# Patient Record
Sex: Female | Born: 1955
Health system: Southern US, Community
[De-identification: ages and names within clinical notes are randomized; demographics above are authoritative.]

## PROBLEM LIST (undated history)

## (undated) DIAGNOSIS — R42 Dizziness and giddiness: Secondary | ICD-10-CM

## (undated) DIAGNOSIS — I471 Supraventricular tachycardia, unspecified: Secondary | ICD-10-CM

## (undated) DIAGNOSIS — E785 Hyperlipidemia, unspecified: Secondary | ICD-10-CM

## (undated) DIAGNOSIS — D869 Sarcoidosis, unspecified: Secondary | ICD-10-CM

## (undated) DIAGNOSIS — G43909 Migraine, unspecified, not intractable, without status migrainosus: Secondary | ICD-10-CM

## (undated) DIAGNOSIS — I639 Cerebral infarction, unspecified: Secondary | ICD-10-CM

## (undated) DIAGNOSIS — M1990 Unspecified osteoarthritis, unspecified site: Secondary | ICD-10-CM

## (undated) DIAGNOSIS — N393 Stress incontinence (female) (male): Secondary | ICD-10-CM

## (undated) HISTORY — DX: Migraine, unspecified, not intractable, without status migrainosus: G43.909

## (undated) HISTORY — DX: Hyperlipidemia, unspecified: E78.5

## (undated) HISTORY — DX: Dizziness and giddiness: R42

## (undated) HISTORY — PX: NO PAST SURGERIES: SHX2092

---

## 1997-11-15 ENCOUNTER — Other Ambulatory Visit: Admission: RE | Admit: 1997-11-15 | Discharge: 1997-11-15 | Payer: Self-pay | Admitting: Obstetrics and Gynecology

## 1998-01-03 ENCOUNTER — Other Ambulatory Visit: Admission: RE | Admit: 1998-01-03 | Discharge: 1998-01-03 | Payer: Self-pay | Admitting: Obstetrics and Gynecology

## 1998-12-03 ENCOUNTER — Ambulatory Visit (HOSPITAL_COMMUNITY): Admission: RE | Admit: 1998-12-03 | Discharge: 1998-12-03 | Payer: Self-pay | Admitting: Gastroenterology

## 1999-01-14 ENCOUNTER — Ambulatory Visit (HOSPITAL_COMMUNITY): Admission: RE | Admit: 1999-01-14 | Discharge: 1999-01-14 | Payer: Self-pay | Admitting: Gastroenterology

## 1999-01-14 ENCOUNTER — Encounter: Payer: Self-pay | Admitting: Gastroenterology

## 1999-04-28 ENCOUNTER — Other Ambulatory Visit: Admission: RE | Admit: 1999-04-28 | Discharge: 1999-04-28 | Payer: Self-pay | Admitting: Obstetrics and Gynecology

## 2000-11-30 ENCOUNTER — Other Ambulatory Visit: Admission: RE | Admit: 2000-11-30 | Discharge: 2000-11-30 | Payer: Self-pay | Admitting: Obstetrics and Gynecology

## 2002-02-21 ENCOUNTER — Encounter: Admission: RE | Admit: 2002-02-21 | Discharge: 2002-02-21 | Payer: Self-pay | Admitting: Cardiology

## 2002-02-21 ENCOUNTER — Encounter: Payer: Self-pay | Admitting: Cardiology

## 2004-07-10 ENCOUNTER — Other Ambulatory Visit: Admission: RE | Admit: 2004-07-10 | Discharge: 2004-07-10 | Payer: Self-pay | Admitting: Obstetrics and Gynecology

## 2005-04-09 ENCOUNTER — Other Ambulatory Visit: Admission: RE | Admit: 2005-04-09 | Discharge: 2005-04-09 | Payer: Self-pay | Admitting: Obstetrics and Gynecology

## 2005-07-14 ENCOUNTER — Other Ambulatory Visit: Admission: RE | Admit: 2005-07-14 | Discharge: 2005-07-14 | Payer: Self-pay | Admitting: Obstetrics and Gynecology

## 2006-03-02 ENCOUNTER — Encounter: Admission: RE | Admit: 2006-03-02 | Discharge: 2006-03-02 | Payer: Self-pay | Admitting: Internal Medicine

## 2006-03-17 ENCOUNTER — Encounter: Admission: RE | Admit: 2006-03-17 | Discharge: 2006-03-17 | Payer: Self-pay | Admitting: Cardiology

## 2006-03-25 ENCOUNTER — Ambulatory Visit: Payer: Self-pay | Admitting: Emergency Medicine

## 2006-03-31 ENCOUNTER — Encounter (INDEPENDENT_AMBULATORY_CARE_PROVIDER_SITE_OTHER): Payer: Self-pay | Admitting: Specialist

## 2006-03-31 ENCOUNTER — Ambulatory Visit: Payer: Self-pay | Admitting: Emergency Medicine

## 2006-03-31 ENCOUNTER — Ambulatory Visit (HOSPITAL_COMMUNITY): Admission: RE | Admit: 2006-03-31 | Discharge: 2006-03-31 | Payer: Self-pay | Admitting: Emergency Medicine

## 2006-04-22 ENCOUNTER — Ambulatory Visit: Payer: Self-pay | Admitting: Emergency Medicine

## 2006-05-03 ENCOUNTER — Ambulatory Visit: Payer: Self-pay | Admitting: Cardiothoracic Surgery

## 2006-05-07 ENCOUNTER — Ambulatory Visit (HOSPITAL_COMMUNITY): Admission: RE | Admit: 2006-05-07 | Discharge: 2006-05-07 | Payer: Self-pay | Admitting: Cardiothoracic Surgery

## 2006-05-10 ENCOUNTER — Ambulatory Visit: Payer: Self-pay | Admitting: Cardiothoracic Surgery

## 2006-05-18 ENCOUNTER — Encounter (INDEPENDENT_AMBULATORY_CARE_PROVIDER_SITE_OTHER): Payer: Self-pay | Admitting: Specialist

## 2006-05-18 ENCOUNTER — Ambulatory Visit: Payer: Self-pay | Admitting: Cardiothoracic Surgery

## 2006-05-18 ENCOUNTER — Inpatient Hospital Stay (HOSPITAL_COMMUNITY): Admission: RE | Admit: 2006-05-18 | Discharge: 2006-05-24 | Payer: Self-pay | Admitting: Cardiothoracic Surgery

## 2006-05-28 ENCOUNTER — Ambulatory Visit: Payer: Self-pay | Admitting: Cardiothoracic Surgery

## 2006-06-03 ENCOUNTER — Ambulatory Visit: Payer: Self-pay | Admitting: Emergency Medicine

## 2006-06-04 ENCOUNTER — Ambulatory Visit: Payer: Self-pay | Admitting: Cardiothoracic Surgery

## 2006-06-04 ENCOUNTER — Encounter: Admission: RE | Admit: 2006-06-04 | Discharge: 2006-06-04 | Payer: Self-pay | Admitting: Cardiothoracic Surgery

## 2006-06-22 ENCOUNTER — Ambulatory Visit: Payer: Self-pay | Admitting: Emergency Medicine

## 2006-07-01 ENCOUNTER — Ambulatory Visit: Payer: Self-pay | Admitting: Emergency Medicine

## 2006-07-15 ENCOUNTER — Ambulatory Visit: Payer: Self-pay | Admitting: *Deleted

## 2006-07-16 ENCOUNTER — Ambulatory Visit: Payer: Self-pay | Admitting: Emergency Medicine

## 2006-10-26 ENCOUNTER — Ambulatory Visit: Payer: Self-pay | Admitting: Emergency Medicine

## 2006-12-03 DIAGNOSIS — J309 Allergic rhinitis, unspecified: Secondary | ICD-10-CM | POA: Insufficient documentation

## 2006-12-03 DIAGNOSIS — D869 Sarcoidosis, unspecified: Secondary | ICD-10-CM

## 2009-06-24 ENCOUNTER — Encounter: Admission: RE | Admit: 2009-06-24 | Discharge: 2009-06-24 | Payer: Self-pay | Admitting: Internal Medicine

## 2010-03-09 ENCOUNTER — Encounter: Payer: Self-pay | Admitting: Internal Medicine

## 2010-05-16 ENCOUNTER — Other Ambulatory Visit: Payer: Self-pay | Admitting: Internal Medicine

## 2010-05-16 ENCOUNTER — Ambulatory Visit
Admission: RE | Admit: 2010-05-16 | Discharge: 2010-05-16 | Disposition: A | Discharge status: Home / Self Care | Payer: PRIVATE HEALTH INSURANCE | Source: Ambulatory Visit | Attending: Internal Medicine | Admitting: Internal Medicine

## 2010-05-16 DIAGNOSIS — R059 Cough, unspecified: Secondary | ICD-10-CM

## 2010-05-16 DIAGNOSIS — R05 Cough: Secondary | ICD-10-CM

## 2010-05-22 ENCOUNTER — Other Ambulatory Visit: Payer: Self-pay | Admitting: Internal Medicine

## 2010-05-22 DIAGNOSIS — R52 Pain, unspecified: Secondary | ICD-10-CM

## 2010-05-23 ENCOUNTER — Ambulatory Visit
Admission: RE | Admit: 2010-05-23 | Discharge: 2010-05-23 | Disposition: A | Discharge status: Home / Self Care | Payer: PRIVATE HEALTH INSURANCE | Source: Ambulatory Visit | Attending: Internal Medicine | Admitting: Internal Medicine

## 2010-05-23 DIAGNOSIS — R52 Pain, unspecified: Secondary | ICD-10-CM

## 2010-05-30 ENCOUNTER — Other Ambulatory Visit: Payer: Self-pay | Admitting: Internal Medicine

## 2010-05-30 ENCOUNTER — Ambulatory Visit
Admission: RE | Admit: 2010-05-30 | Discharge: 2010-05-30 | Disposition: A | Discharge status: Home / Self Care | Payer: PRIVATE HEALTH INSURANCE | Source: Ambulatory Visit | Attending: Internal Medicine | Admitting: Internal Medicine

## 2010-05-30 DIAGNOSIS — R609 Edema, unspecified: Secondary | ICD-10-CM

## 2010-05-30 DIAGNOSIS — M79672 Pain in left foot: Secondary | ICD-10-CM

## 2010-07-01 NOTE — Assessment & Plan Note (Signed)
[facility]                             PULMONARY OFFICE NOTE   NAME:Kristy [name]                        MRN:          [id number]  DATE:10/26/2006                            DOB:          Feb 02, 1956    SUBJECTIVE:  Kristy [name] is a 55 year old woman with biopsy-proven  sarcoidosis with pulmonary involvement that includes some mild  interstitial infiltrates and associated nodular changes. She has been  treated with prednisone for an approximately 8 week course that included  a taper down to 0. She completed the prednisone in June of this year.  She tells me that during the summer months since her prednisone has been  discontinued she has had more difficulty breathing outside particularly  in the heat. She has trouble walking and described some dyspnea as she  walked from our parking lot into the office. She denies any chest pain.  When she lies supine she sometimes hears a noise when she breaths that  she believes may be wheezing or congestion. She has had increased nasal  drainage and congestion since her prednisone was discontinued. She also  has some other generalized less specific complaints that include some  neck discomfort and muscle pain with activity. She has had an increase  in her lower extremity edema.   CURRENT MEDICATIONS:  1. Aspirin 81 mg daily.  2. Cardia XT 120 mg q.h.s.  3. Furosemide 40 mg daily.  4. Potassium chloride 10 mEq daily.  5. Over-the-counter nasal decongestant p.r.n.  6. Phenergan p.r.n.  7. Afrin nasal spray p.r.n.  8. Over-the-counter sinus medications p.r.n.   PHYSICAL EXAMINATION:  GENERAL:  This is an overweight, well-appearing  woman in no distress.  VITAL SIGNS:  Her weight is 183 pounds, temperature is 97.1, blood  pressure 128/76, heart rate 90, SPO2 99% on room air.  HEENT:  Oropharynx is benign.  NECK:  No stridor. She has a healing mediastinoscopy scar.  LUNGS:  Distant but clear. There is no wheeze  on a forced expiration.  SKIN:  Clear without any evidence of new rash.  ABDOMEN:  Benign.  HEART:  Regular without murmur.  EXTREMITIES:  No lower extremity edema.   IMPRESSION:  History of sarcoidosis with documented pulmonary disease  now presenting with some mild increased dyspnea and other nonspecific  symptoms that include muscle aches and pain, neck discomfort and some  lower extremity edema. It is not clear to me that these symptoms are  related to her sarcoidosis but I would like to establish a baseline for  her with regard to her pulmonary function and check to see if there has  been any radiographical changes. Will obtain baseline PFTs now and a  comparison chest x-ray. She will keep track of her symptoms over the  next several weeks and if these are worsening in conjunction with  radiographical changes or abnormal physiology then we may need to  restart prednisone. I underscored with Kristy [name] that she needs to have  an ophthalmologic exam every 6-12 months given her history of  sarcoidosis.     Les Pou.  Delton Coombes, MD  Electronically Signed    RSB/MedQ  DD: 10/26/2006  DT: 10/27/2006  Job #: [id number]   cc:   Kristy [name]. Kristy [name], M.D.

## 2010-07-01 NOTE — Assessment & Plan Note (Signed)
[facility]                             PULMONARY OFFICE NOTE   NAME:[name], [name]                          MRN:          [id number]  DATE:07/01/2006                            DOB:          1955-11-30    SUBJECTIVE:  Kristy [name] is a 55 year old woman with a history of biopsy-  proven sarcoidosis.  I have been following her for bilateral nodular  infiltrates, dyspnea, and fatigue.  She tells me that since our last  visit her breathing is better.  At that time we had decided to keep her  on prednisone 40 mg daily which was started in April of this year.  She  is able to do more without becoming short of breath.  She is also having  less fatigue.  She is not back to her usual baseline.  She had been  complaining of some nausea and a bad taste in her mouth and was started  on Aciphex by her primary physician with some improvement.   CURRENT MEDICATIONS:  1. Aspirin 81 mg daily.  2. Cartia XT 120 mg nightly.  3. Prednisone 40 mg daily.  4. Aciphex 20 mg t.i.d. before meals.  5. Over the counter nasal decongestant p.r.n.  6. Promethazine 25 mg p.r.n.  7. Oxycodone/APAP 5/500 mg p.r.n.   EXAM:  GENERAL:  Kristy [name] is a comfortable, somewhat overweight  African American woman who is in no distress.  Her weight is 173 pounds,  temperature 98.0, blood pressure 142/84, heart rate 84, SPO2 98% on room  air.  HEENT:  The posterior pharynx is without erythema.  NECK:  Supple without stridor.  She does have a healing mediastinoscopy scar with a retained suture on  the left without any evidence of obvious infection or cellulitis.  LUNGS:  For the most part clear to auscultation bilaterally.  She has no  wheezing on a forced expiration.  HEART:  Regular rate and rhythm without murmur.  ABDOMEN:  Soft, nontender, nondistended, positive bowel sounds.  EXTREMITIES:  Have no cyanosis, clubbing, or edema.   IMPRESSION:  Pulmonary sarcoidosis, now status post of  one month of  prednisone 40 mg daily.  She is beginning to feel better with  improvement in her exertional tolerance and her shortness of breath.  She continues to have some fatigue and some nausea, but these symptoms  are improved also.   PLANS:  1. I will continue her on prednisone 40 mg daily for 2 more weeks.  2. After 2 more weeks I will repeat her CT scan of the chest to see if      there has been improvement in her nodular infiltrates.  3. Will follow up after her CT scan to discuss the results.  If she      has had radiographical      improvement and continues to have clinical improvement then we will      begin tapering her prednisone at that time.     Kristy Peer, MD     RSB/MedQ  DD: 07/01/2006  DT: 07/01/2006  Job #:  [id number]   cc:   Kristy [name]. Kristy [name], M.D.

## 2010-07-01 NOTE — Discharge Summary (Signed)
NAMETANISHI, NAULT NO.:  [id number]   MEDICAL RECORD NO.:  [id number]          PATIENT TYPE:  INP   LOCATION:  2004                         FACILITY:  [facility]   PHYSICIAN:  Nelda Severe, MD      DATE OF BIRTH:  [birth year]   DATE OF ADMISSION:  05/18/2006  DATE OF DISCHARGE:  05/24/2006                               DISCHARGE SUMMARY   This 55 year old lady was admitted for management of right sciatic pain  secondary to L5-S1 disc herniation.  On the day of admission, she was  taken to the operating room where an uneventful L5-S1 right-sided  laminotomy disc excision was carried out.  Her leg pain was resolved  postoperatively.  She was discharged the morning after surgery.  Instructions to followup in the office in approximately 2 weeks.  She is  to avoid bending or lifting.  Pain control will be oral hydrocodone.   FINAL DIAGNOSIS:  L5-S1 disc herniation.   DISCUSSION:  See above for disposition and instructions, which include  no bending or lifting until further instructed.      Nelda Severe, MD     MT/MEDQ  D:  06/25/2006  T:  06/25/2006  Job:  [id number]

## 2010-07-01 NOTE — Assessment & Plan Note (Signed)
[facility]                             PULMONARY OFFICE NOTE   NAME:Kristy [name], Kristy [name]                        MRN:          [id number]  DATE:07/16/2006                            DOB:          22-Oct-1955    Kristy [name] is a 55 year old woman with sarcoidosis and pulmonary  involvement with mild interstitial and nodular infiltrates.  She follows  up today after a 6-week course of prednisone 40 mg.  She tells me that  she clinically feels much better.  She is able to do more.  She feels  stronger.  She still does get some fatigue with exertion, but this is  much improved.  She is also able now to lie flat.  Her CT scan of the  chest was performed yesterday to look for interval change in her nodular  pulmonary disease, and this is reviewed below.   CURRENT MEDICATIONS:  1. Aspirin 81 mg daily.  2. Cartia XT 120 mg nightly.  3. Prednisone 40 mg daily.  4. Over-the-counter nasal decongestant p.r.n.  5. Promethazine 25 mg p.r.n.  6. Oxycodone/APAP 5/500 mg p.r.n.   EXAMINATION:  GENERAL:  This is a pleasant, overweight woman, who is in  no distress on room air.  Her weight is 176 pounds, up 3 pounds from her last visit.  Temperature  98.2.  Blood pressure 124/78.  Heart rate 87.  SPO2 98% on room air.  HEENT EXAM:  The oropharynx is benign.  NECK:  Has a healing mediastinoscopy scar.  LUNGS:  Have a few mild bibasilar inspiratory crackles.  HEART:  Has a regular rate and rhythm without murmur.  ABDOMEN:  Obese, soft, non-tender with positive bowel sounds.  EXTREMITIES:  Have no cyanosis, clubbing, or edema.  High-resolution CT scan of the chest was performed yesterday.  This  shows little change in her mild scattered interstitial nodular disease.  Her lymphadenopathy is stable.  Again noted, were bilateral breast  lesions, which have been followed by her primary physician.   IMPRESSION:  Sarcoidosis that is clinically improving with CT scan of  the  chest that is stable, but without full clearance of her infiltrates.  I believe that she may be left with some baseline parenchymal disease,  but clinically she is improving, and I am interested in beginning to  taper her steroids.   PLAN:  1. She will do prednisone 20 mg daily for 2 weeks, and then 10 mg      daily for 2 weeks, and then stop.  2. She will follow up with me after 1 month to ensure that she is      clinically stable off      immunosuppressive therapy.  She will call me in the interim if she      has any difficulty, or any new symptoms evolve while the taper is      underway.     Leslye Peer, MD  Electronically Signed    RSB/MedQ  DD: 07/16/2006  DT: 07/16/2006  Job #: [id number]   cc:   Merlene Laughter. Renae Gloss, M.D.

## 2010-07-01 NOTE — Assessment & Plan Note (Signed)
[facility]                             PULMONARY OFFICE NOTE   NAME:Aaronson, ALIA PARSLEY                        MRN:          [id number]  DATE:06/22/2006                            DOB:          [birth year]    SUBJECTIVE:  Ms. Marlette is a very pleasant 55 year old woman who follows  of today for her pulmonary sarcoidosis found on lung biopsy performed by  Dr. Kathlee Nations Trigt.  She was started on prednisone 40 mg daily and are  last visit, which was April 17.  She initially states that she was  beginning to feel better with decreased fatigue, better with decreased  fatigue, better breathing, and decrease in her chest tightness and  discomfort.  Unfortunately, over the last week she has begun to develop  more symptoms reminiscent of her original syndrome; including nausea,  decreased appetite, some chest tightness and palpitations.  She states  that these episodes can calm with periods of stress and anxiety.  She  also has similar symptoms when she exert herself.  She complains of an  occasional bad taste in her mouth and some neck and back pain.  Of  note given the constellation of symptoms, I have reviewed with Ms.  Cantrall that she did, indeed, have a mammogram performed to further  evaluate and abnormality identified on her original CT scan of the  chest.  She states that this was done and this was normal.   CURRENT MEDICATIONS:  1. Prednisone 40 mg daily.  2. Aspirin 81 mg daily.  3. Cardia XT hundred and 20 mg q.h.s.  4. Over-the-counter nasal decongestant.  5. Promethazine 25 mg p.r.n.  6. Oxycodone/APAP 5/500 mg PR and.   PHYSICAL EXAMINATION:  In general this is a very pleasant woman who is  in no distraction room air.  Her weight is 173 pounds which is down to  pounds from her last visit.  Her temperature is 98.0; blood pressure  122/74; heart rate 87; and a pO2 of 97% on room air.  HEENT EXAM:  Benign.  She does not have any evidence of  thrush.  NECK:  Shows a well-healing mediastinoscopy scar with a suture that that  has not resolved at the left margin of the scar.  There is no evidence  of cellulitis or loss of integrity of her suture line.  LUNGS:  Clear to auscultation, bilaterally.  HEART:  Regular rate and rhythm without murmur.  ABDOMEN:  Benign.  EXTREMITIES:  No cyanosis, clubbing, edema.  SKIN:  I do not known any nodular rashes.   IMPRESSION:  Pulmonary sarcoidosis on prednisone 40 mg for the last  three weeks.  Initially she felt systemically, symptomatically improved;  but now she is experiencing nausea, malaise, and palpitations.  I  question whether this may be related to a separate disease process be on  her pulmonary nodular disease.  It is also possible that she is  experiencing side effects from prednisone that are mimicking her  original complaints.  I do believe that we need to ensure that her  mammogram was actually  normal given her constellation of symptoms.  I  believe this to Dr. Renae Gloss and discussed this with the patient.   PLAN:  1. I will continue her prednisone 40 mg daily for the next week.  I      will then follow up with her and obtain a chest x-ray.  We will      discuss weaning her prednisone at that time the pending upon her      radiographic resolution.  We may decide that if she continues to      have symptoms, that she needs a chest CT to see if her nodular      disease is improving.  2. If Ms. Guercio's nausea, malaise, loss of appetite, etcetera are not      better on decreasing doses of prednisone then, I believe, that she      may merit further evaluation for other causes.     Leslye Peer, MD  Electronically Signed    RSB/MedQ  DD: 06/22/2006  DT: 06/22/2006  Job #: [id number]   cc:   Merlene Laughter. Renae Gloss, M.D.

## 2010-07-04 NOTE — Op Note (Signed)
Kristy [name], ALCORTA NO.:  [id number]   MEDICAL RECORD NO.:  [id number]          PATIENT TYPE:  INP   LOCATION:  2004                         FACILITY:  [facility]   PHYSICIAN:  Kerin Perna, M.D.  DATE OF BIRTH:  August 04, 1955   DATE OF PROCEDURE:  05/19/2006  DATE OF DISCHARGE:                               OPERATIVE REPORT   OPERATION:  1. Mediastinoscopy.  2. Right video assisted thoracoscopic surgery and wedge resection of      right upper lobe lesion.   PREOPERATIVE DIAGNOSIS:  Inflammatory lung disease, rule out sarcoid,  rule out possible cancer.   POSTOPERATIVE DIAGNOSIS:  Inflammatory lung disease, rule out sarcoid,  rule out possible cancer.   SURGEON:  Kerin Perna, M.D.   ASSISTANT:  Jerold Coombe, P.A.-C.   ANESTHESIA:  General.   INDICATIONS:  The patient is a 55 year old black female with fatigue,  shortness of breath, and an abnormal chest x-ray.  CT scan showed hilar  adenopathy as well as some small nodules bilaterally as well as an  infiltrative density measuring over 2 cm in the right upper lobe.  The  CAT scan showed hypermetabolic activity of 3.7 SUV of the infiltrated  density and there is no significant increased activity of the  mediastinal nodes.  The patient's pulmonologist, Dr. Delton Coombes, felt she  probably had sarcoidosis but needed a tissue diagnosis and she was  recommended for mediastinoscopy and possible VATS and lung biopsy.   Prior to surgery, I examined the patient in the office and reviewed the  results of the scans with the patient.  I discussed the indications and  benefits of mediastinoscopy and right VATS and lung biopsy.  I reviewed  the alternatives to surgery, the expected recovery, the associated risks  of bleeding, prolonged air leak, pneumonia and infection.  She  understood and agreed to proceed under informed consent.   PROCEDURE:  The patient was brought to the operating room and placed  supine on  the operating table where general anesthesia was induced.  The  neck and chest were prepped for the mediastinoscopy.  A collar incision  was made approximately 2 cm and carried down to the pretracheal space.  The mediastinoscope was inserted into the anterior mediastinum down to  the carina.  There were no pathologic lymph nodes noted but some lymph  node tissue was sampled and submitted for permanent section.  There was  no significant bleeding.  The incision was closed in layers with Vicryl.  A sterile dressing was applied.   The patient was then turned for the VATS procedure in order to obtain  pathologic tissue.  The anesthesiologist placed a double lumen  endotracheal tube and the right chest was prepped and draped.  Three  VATS portal incisions were made and the camera was inserted.  The upper  lobe showed an inflammatory scar like lesion in the posterolateral  aspect of the upper lobe.  Using the endovascular GIA stapler, this was  wedged out and the staple line was intact.  It was reinforced with  CoSeal adhesive.  The frozen section came back from the pathologist as  inflammatory with granulomatosis changes consistent with sarcoidosis.  A  28 French chest tube was placed in the  right pleural space and directed to the apex and secured to the skin.  The VATS incisions were closed in layers using Vicryl and the lung was  reinflated under direct vision prior to closing the incisions.  The  patient was extubated and returned to the recovery room in stable  condition.      Kerin Perna, M.D.  Electronically Signed     PV/MEDQ  D:  05/19/2006  T:  05/19/2006  Job:  [id number]

## 2010-07-04 NOTE — Assessment & Plan Note (Signed)
[facility]                             PULMONARY OFFICE NOTE   NAME:Kristy [name], Kristy [name]                        MRN:          [id number]  DATE:03/25/2006                            DOB:          Jul 05, 1955    REASON FOR CONSULTATION:  We were asked by Dr. Jacinto Halim to evaluate Ms.  Buchholz for an abnormal CT scan of the chest and for exertional dyspnea.   BRIEF HISTORY:  Kristy [name] is a 55 year old woman with little past  medical history beyond allergic rhinitis. She has typically been quite  active. She tells me that last fall she began to have worsening mucous  production and cough and some mid sternal chest discomfort without any  evidence of shortness of breath. Since that time, she has had some  progression of her symptoms and some new symptoms as well including  nausea at rest. She is now having some shortness of breath with exertion  and with bending. She has also noticed some sweats which she wonders may  be hot flashes. She notices shortness of breath mostly when she walks up  hill about a half a mile outside. She has been given albuterol without  any real change in her symptoms. She has had some dry cough over the  last week but is not making much in the way of sputum. She had a  syncopal episode about three weeks ago after standing quickly which  prompted a cardiovascular workup. Stress test is as dictated below and  was low risk. She has also had a transthoracic echocardiogram that  confirmed normal LV function but some very mild right atrial and right  ventricular dilation. CT and pulmonary angiogram has also been performed  which did not show any evidence of pulmonary embolism but which showed  nodular disease as detailed below. She is referred now to evaluate her  dyspnea and her abnormal CT.   PAST MEDICAL HISTORY:  Allergic rhinitis.   ALLERGIES:  No known drug allergies.   MEDICATIONS:  1. Aspirin 81 mg daily.  2. Over-the-counter nasal  decongestant p.r.n.   SOCIAL HISTORY:  The patient is single. She is a never smoker. 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 She lives with  her 59 year old mother for whom she is a primary care giver. She owns  cats and owned birds as a child but does not own birds currently. She  has no known tuberculosis exposure, and she was never in the [employer].   FAMILY HISTORY:  Significant for coronary artery disease.   REVIEW OF SYSTEMS:  Significant for findings mentioned in the HPI. Also  please note that she has had some chest pain, tightness and burning  associated with acid indigestion. She has also had some weight gain over  the last several months, headache, joint stiffness in her right elbow.  She denies any rash or any significant visual changes. She does wear one  contact lens, and her vision has been stable.   PHYSICAL EXAMINATION:  In general, this is a pleasant well-appearing  woman. She  is no distress on room air. Her weight is 180 pounds.  Temperature 97.7, blood pressure 140/82, heart rate 72, SpO2 98% on room  air. Height is 5 foot 4 inches.  HEENT:  The oropharynx is clear. There are no oral lesions.  Her neck is supple without lymphadenopathy.  She has no stridor. Her lungs are clear to auscultation bilaterally.  HEART:  Has a regular rate and rhythm without murmurs, rubs, or gallops.  ABDOMEN:  Soft, nontender, nondistended with positive bowel sounds. No  hepatosplenomegaly.  Her extremities have no clubbing, cyanosis, or edema.  Neurologically, she is alert and oriented x3 and has grossly nonfocal  exam.   LABORATORY AND X-RAY DATA:  CT scan of the chest performed on March 17, 2006 showed no evidence of pulmonary embolism, bilateral hilar  lymphadenopathy with peribronchial and vascular nodules and some  subpleural nodules as well. Also noted was a confluent density in the  posterior right breast. A transthoracic echocardiogram performed  on  March 12, 2006 showed normal LV function, mild RV and RA dilatation  and mild tricuspid regurgitation. Stress test performed in late January  showed EKG changes that were consistent with some mild ST depression at  maximum workload. Her global LV function was within normal limits. She  had a tiny apical zone of mild ischemia versus some apical thinning.  Overall, this was interpreted as a low-risk study.   IMPRESSION:  1. Nodular disease with lymphadenopathy. The differential diagnoses      for this is broad and includes vasculitis, atypical infection, or      sarcoidosis. Much less likely but certainly possible would be      malignancy.  2. Probable mild pulmonary hypertension. I question whether this may      be related to her underlying lung disease. This will need to be      further evaluated after we have addressed the nodular disease      outlined above.  3. Questionable right breast mass, currently being evaluated per the      patient's primary physician.   PLAN:  1. Fiberoptic bronchoscopy next week. I have discussed the pros and      cons of this procedure with Ms. Droge, and she agrees to proceed.  2. Further workup of her pulmonary hypertension after the above.  3. She may require mediastinoscopy if I am unable to achieve a tissue      diagnosis.     Leslye Peer, MD  Electronically Signed    RSB/MedQ  DD: 04/19/2006  DT: 04/20/2006  Job #: [id number]   cc:   Cristy Hilts. Jacinto Halim, MD  Merlene Laughter Renae Gloss, M.D.

## 2010-07-04 NOTE — Discharge Summary (Signed)
Kristy [name], RHAMES NO.:  [id number]   MEDICAL RECORD NO.:  [id number]           PATIENT TYPE:   LOCATION:                                 FACILITY:   PHYSICIAN:  Kerin Perna, M.D.  DATE OF BIRTH:  1955-10-19   DATE OF ADMISSION:  05/19/2006  DATE OF DISCHARGE:                               DISCHARGE SUMMARY   DATE OF ANTICIPATED DISCHARGE:  May 23, 2006.   PRIMARY ADMITTING DIAGNOSIS:  Right upper lobe lung nodules.   ADDITIONAL/DISCHARGE DIAGNOSES:  1. Right upper lobe lung nodules, nonnecrotizing granulomatous      disease.  Probable sarcoid.  2. Hypertension.  3. Allergic rhinitis.   PROCEDURES PERFORMED:  1. Mediastinoscopy.  2. Right VAP with right upper lobe wedge resection.   HISTORY OF PRESENT ILLNESS:  The patient is a 55 year old black female  who was referred to Dr. Donata Clay recently with right upper lobe lung  nodule.  She initially presented with fatigue and shortness of breath  associated with chest tightness.  She had been seen by Dr. Levy Pupa,  and he had performed a bronchoscopy and transbronchial biopsies which  were negative in the right upper lobe.  The differential diagnosis  included sarcoidosis versus vasculitis versus opportunistic infection.  A CT scan showed increased hilar adenopathy as well as bilateral  pulmonary nodules and infiltrated density measuring over 2 cm in the  right upper lobe.  A PET scan was also performed which showed  hypermetabolic activity of three point in the right upper lobe with an  MCV of 3.7 with no significant increased activity in the mediastinal  nodes.  Because the diagnosis was not able to be obtained  percutaneously, she was referred to Dr. Donata Clay for a thoracic surgery  consultation.  Dr. Donata Clay reviewed all her films and thought that her  best course of action would be to proceed with a VAP lung biopsy.  He  explained the risks, benefits, and alternatives of the procedure to  the  patient, and she agreed to proceed with surgery.   HOSPITAL COURSE:  Kristy [name] was admitted to [facility] on  May 18, 2006 and underwent a right VAP with right upper lobe wedge  resection.  She tolerated the procedure well and was transferred to the  floor in stable condition.  Postoperatively she has done very well.  Her  final pathology was non-necrotizing granulomatous disease with no  evidence of malignancy.  It was felt to represent sarcoidosis.  She is  recovering nicely.  All her incisions are healing well.  Her chest tube  has been decreased to water seal and although she has a small air leak  intermittently on exam, her chest x-ray shows no evidence of  pneumothorax.  She has been afebrile, and all vital signs have been  stable.  She is ambulating independently in the hallways and is making  good progress.  She is tolerating a regular diet and is having normal  bowel and bladder function.  Her most recent labs show hemoglobin of  11.5,  hematocrit 33.4, white count 7.9, platelets 249, sodium 143,  potassium 3.5 which has been repleted, BUN 4, creatinine 0.69.  All her  lines have, otherwise, been removed and she is awaiting chest tube  removal hopefully on May 22, 2006 as her air leak has resolved.  Hopefully if her chest tube can be removed on Saturday the 5th she will  undergo a PA and lateral chest x-ray on Sunday, May 23, 2006, and if  she has remained stable, otherwise, and is doing well at that time we  anticipate discharge home.   DISCHARGE MEDICATIONS:  1. Tylox 1-2 q.4-6 h. p.r.n. for pain.  2. Cartia XT 120 mg daily.  3. Aspirin 81 mg daily.  4. Zyrtec 10 mg daily.   DISCHARGE INSTRUCTIONS:  She is asked to refrain from driving, heavy  lifting or strenuous activity.  She may continue ambulating daily and  using her incentive spirometer.  She may shower daily and clean her  incisions with soap and water.  She will continue her same preoperative   diet.   DISCHARGE FOLLOWUP:  She will return for follow up with Dr. Donata Clay on  April 18 at 12:30 p.m.  She will have a chest x-ray at [facility] one hour prior to this appointment and should bring her films for  Dr. Donata Clay to review.  In the interim if she experiences any problems  or has questions she is asked to contact our office.      Coral Ceo, P.A.      Kerin Perna, M.D.  Electronically Signed    GC/MEDQ  D:  05/21/2006  T:  05/21/2006  Job:  [id number]   cc:   Leslye Peer, MD  Cristy Hilts. Jacinto Halim, MD  Merlene Laughter Renae Gloss, M.D.  Kerin Perna, M.D.

## 2010-07-04 NOTE — Op Note (Signed)
[name], [name]                 ACCOUNT NO.:  [id number]   MEDICAL RECORD NO.:  [id number]          PATIENT TYPE:  AMB   LOCATION:  ENDO                         FACILITY:  [facility]   PHYSICIAN:  Leslye Peer, MD    DATE OF BIRTH:  1955-04-25   DATE OF PROCEDURE:  03/31/2006  DATE OF DISCHARGE:                               OPERATIVE REPORT   PROCEDURE:  Fiberoptic bronchoscopy with bronchoalveolar lavage,  transbronchial biopsies and Wang needle biopsies.   OPERATOR:  Leslye Peer, M.D.   INDICATIONS:  Pulmonary nodules.   MEDICATIONS GIVEN:  1. Fentanyl 75 mg IV in divided dose.  2. Versed 6 mg IV in divided doses.  3. 1% lidocaine to the bronchoalveolar tree, 45 mL total.  4. 2% viscous lidocaine to the right nare.   CONSENT:  Informed consent was obtained from the patient prior to  sedation and a signed copy is on her hospital chart.   PROCEDURE DETAILS:  After informed consent was obtained, conscious  sedation was initiated as indicated above. The fiberoptic bronchoscope  was introduced without difficulty through the right nare.  The posterior  pharynx was visualized and the glottis was normal in appearance.  The  cords moved normally on phonation and with deep inspiration. The trachea  was intubated and local anesthesia was achieved with 1% lidocaine to the  bronchoalveolar tree.  The trachea was normal in appearance with the  exception of some mild pitting.  The main carina was sharp.  The  bilateral mainstem bronchi also exhibited some pitting and some slight  hyperpigmentation.  Also noted was some vascularity on the medial aspect  of the right main stem bronchus and into the bronchus intermedius.  No  endobronchial lesions or abnormal secretions were noted.  Attention was  turned to the lobar bronchi.  The right upper lobe, right middle lobe,  right lower lobe bronchi (as well as the segmental airways) were all  within normal limits, again, without any  endobronchial lesions or  abnormal secretions.  Attention was turned to the left sided exam.  The  left main stem bronchus was grossly normal.  The left upper lobe,  lingular, and left lower lobe bronchi were all within normal limits as  were the segmental airways.  Wang needle biopsies were performed in the  bronchus intermedius just distal to the right upper lobe takeoff, two  biopsies were performed.  A Wang needle biopsy from a second location  was then performed in the left lower lobe, right after the left upper  lobe takeoff.  These biopsies were pooled and will be sent for  pathological evaluation.  Transbronchial biopsies were then performed  from the right upper lobe from several subsegments.  Finally,  bronchoalveolar lavage was performed of the right upper lobe with 60 mL  of normal saline instilled and approximately 20 mL returned.  This will  be sent for cytology and for microbiology.  The patient tolerated the  procedure quite well.  There was no significant bleeding and there was  good hemostasis by the end of the case.  A post procedure chest x-ray is  pending at the time of this dictation.   SAMPLES:  1. Transbronchial Wang needle biopsies from the bronchus intermedius      and from the left lower lobe airways.  2. Transbronchial biopsies performed from several subsegments of the      right upper lobe.  3. Bronchoalveolar lavage was performed from the right upper lobe.   PLANS:  I will follow up Kristy [name]'s pathology, cytology and  microbiology with her when it becomes available later this week by  phone. We would then plan any further testing necessary to achieve a  diagnosis or appropriate therapy.      Leslye Peer, MD  Electronically Signed     RSB/MEDQ  D:  03/31/2006  T:  03/31/2006  Job:  [id number]

## 2010-07-04 NOTE — Assessment & Plan Note (Signed)
[facility]                             PULMONARY OFFICE NOTE   NAME:Dawkins, GAVRIELA CASHIN                        MRN:          [id number]  DATE:06/03/2006                            DOB:          06/10/1955    SUBJECTIVE:  Ms. Streed is a very pleasant 55 year old woman seen  originally in February 2008 for abnormal CT scan of the chest with  nodular infiltrates.  We performed fiberoptic bronchoscopy with biopsies  which were nondiagnostic.  I referred her to Dr. Kathlee Nations Trigt for  mediastinoscopy which was performed successfully at the beginning of  April.  Her biopsy results have identified some fibrotic changes with  non-necrotizing granulomas without evidence of micro-organisms.  No  evidence of malignancy was identified.  These results were consistent  with the diagnosis of pulmonary sarcoidosis.  She returns today to plan  therapy.  She tells me that she is still having some very significant  exertional dyspnea.  She is not coughing very frequently and she does  not hear wheezing.  Her most bothersome symptom is that she feels very  fatigued and is not able to do her usual daily activities.  Yesterday  she worked cleaning the house and had to stop due to this fatigue, and  today she feels very drained after making that attempt.   CURRENT MEDICATIONS:  1. Aspirin 81 mg daily.  2. Cardia XT 120 mg nightly.  3. Over-the-counter nasal decongestant p.r.n.  4. Promethazine 25 mg p.r.n.  5. Oxycodone/APAP 5 mg/500 mg p.r.n.   EXAM:  GENERAL:  This is a very pleasant woman who is in no distress on  room air.  VITAL SIGNS:  Weight 175 pounds.  Temperature 98.3.  Blood pressure  112/78.  Heart rate 78.  SPO2 98% on room air.  HEENT EXAM:  The oropharynx is benign.  There is no evidence of thrush.  NECK:  There is a healing scar from her mediastinoscopy without any  surrounding erythema.  LUNGS:  Clear to auscultation bilaterally.  I do not hear any  wheezing.  HEART:  Regular rate and rhythm without murmur.  ABDOMEN:  Not examined.  EXTREMITIES:  No edema.   IMPRESSION:  Ms. Slagel is a 55 year old woman with pulmonary  sarcoidosis confirmed by biopsy.  Given her clinical symptoms and her  radiographical findings, I believe that she merits treatment with  corticosteroids.  We discussed the benefits and side effects of these  medications today.  She agrees to a trial of prednisone as detailed  below.   PLAN:  1. Prednisone 40 mg daily, probably a four-week duration before      attempting to wean.  2. She will follow up with me in two weeks to assess her progress and      also her tolerance of      the medications.  3. She will call me in the interim if she has any difficulty after      starting the prednisone.     Leslye Peer, MD  Electronically Signed    RSB/MedQ  DD:  06/03/2006  DT: 06/03/2006  Job #: [id number]   cc:   Cristy Hilts. Jacinto Halim, MD  Merlene Laughter Renae Gloss, M.D.  Kerin Perna, M.D.

## 2011-08-11 ENCOUNTER — Encounter: Payer: Self-pay | Admitting: Obstetrics and Gynecology

## 2011-08-11 ENCOUNTER — Ambulatory Visit (INDEPENDENT_AMBULATORY_CARE_PROVIDER_SITE_OTHER): Payer: PRIVATE HEALTH INSURANCE | Admitting: Obstetrics and Gynecology

## 2011-08-11 VITALS — BP 122/68 | Temp 98.0°F | Resp 16 | Ht 64.0 in | Wt 163.0 lb

## 2011-08-11 DIAGNOSIS — Z01419 Encounter for gynecological examination (general) (routine) without abnormal findings: Secondary | ICD-10-CM

## 2011-08-11 DIAGNOSIS — Z124 Encounter for screening for malignant neoplasm of cervix: Secondary | ICD-10-CM

## 2011-08-11 DIAGNOSIS — N9089 Other specified noninflammatory disorders of vulva and perineum: Secondary | ICD-10-CM | POA: Insufficient documentation

## 2011-08-11 NOTE — Progress Notes (Signed)
Last Pap: 2008 WNL: Yes Regular Periods:no Contraception: None  Monthly Breast exam:yes Tetanus<68yrs:yes Nl.Bladder Function:yes Daily BMs:yes Healthy Diet:yes Calcium: Yes/taking Vitamin D w/ Calcium Mammogram:yes/2013 Exercise:yes Seatbelt: yes Abuse at home: no Stressful work:yes Sigmoid-colonoscopy: Yes Bone Density: Yes  *Pt wants to discuss possible pregnancy after menopause & STD screening  Subjective:    Kristy [name] is a 55 y.o. female No obstetric history on file. who presents for annual exam. The patient has no complaints today. This is the patient's first visit since 2007.  She has developed pulmonary sarcoidosis.  She questions whether she can have a child at this point in her life.  She wants to rule out sex she transmitted infections. The patient was told many years ago that she had a herpes infection.  She was then told by the same physician that she did not.  She has not had outbreaks recently.  The following portions of the patient's history were reviewed and updated as appropriate: allergies, current medications, past family history, past medical history, past social history, past surgical history and problem list. See above.  Review of Systems Pertinent items are noted in HPI. Gastrointestinal:No change in bowel habits, no abdominal pain, no rectal bleeding Genitourinary:negative for dysuria, frequency, hematuria, nocturia and urinary incontinence    Objective:     BP 122/68  Temp 98 F (36.7 C)  Resp 16  Ht 5\' 4"  (1.626 m)  Wt 163 lb (73.936 kg)  BMI 27.98 kg/m2  Weight:  Wt Readings from Last 1 Encounters:  08/11/11 163 lb (73.936 kg)     BMI: Body mass index is 27.98 kg/(m^2). General Appearance: Alert, appropriate appearance for age. No acute distress HEENT: Grossly normal Neck / Thyroid: Supple, no masses, nodes or enlargement Lungs: clear to auscultation bilaterally Back: No CVA tenderness Breast Exam: No masses or nodes.No dimpling, nipple  retraction or discharge. Cardiovascular: Regular rate and rhythm. S1, S2, no murmur Gastrointestinal: Soft, non-tender, no masses or organomegaly  ++++++++++++++++++++++++++++++++++++++++++++++++++++++++  Pelvic Exam: External genitalia: normal general appearance Vaginal: atrophic mucosa Cervix: normal appearance Adnexa: normal bimanual exam Uterus: normal size shape and consistency Rectovaginal: normal rectal, no masses  ++++++++++++++++++++++++++++++++++++++++++++++++++++++++  Lymphatic Exam: Non-palpable nodes in neck, clavicular, axillary, or inguinal regions  Psychiatric: Alert and oriented, appropriate affect.    Urinalysis:Not done      Assessment:    Normal gyn exam Menopause   Considered having children of possible.  Rule out sexually transmitted infections.  Questionable past history of herpes virus.  Pulmonary sarcoidosis  Overweight or obese: Yes  Pelvic relaxation: No  Menopausal symptoms: Yes. Severe: No.   Plan:    Mammogram. Pap smear.   A long discussion was had with the patient about in vitro fertilization, ovarian stimulation, and the difficulties associated with having a child at this time in her life.  She was told to consider all of the possibilities.  She will call us if she wants to discuss further.  Follow-up:  for annual exam  STD screen request: GC, chlamydia, HSV,  RPR: No,  HBsAg: No.  Hepatitis C: No.  The updated Pap smear screening guidelines were discussed with the patient. The patient requested that I obtain a Pap smear: Yes.  Kegel exercises discussed: Yes.  Proper diet and regular exercise were reviewed.  Annual mammograms recommended starting at age 62. Proper breast care was discussed.  Screening colonoscopy is recommended beginning at age 4.  Regular health maintenance was reviewed.  Sleep hygiene was discussed.  Adequate calcium  and vitamin D intake was emphasized.  Mylinda Latina.D.

## 2011-08-13 LAB — PAP IG, CT-NG, RFX HPV ASCU: GC Probe Amp: NEGATIVE

## 2011-08-24 ENCOUNTER — Telehealth: Payer: Self-pay | Admitting: Obstetrics and Gynecology

## 2011-08-27 ENCOUNTER — Telehealth: Payer: Self-pay | Admitting: Obstetrics and Gynecology

## 2011-08-27 NOTE — Telephone Encounter (Signed)
See telephone call. Dr. Nahzir Pohle 

## 2011-08-27 NOTE — Telephone Encounter (Signed)
Triage/upcoming appt 

## 2011-08-27 NOTE — Telephone Encounter (Signed)
Spoke to pt who is reluctant to speak to anyone but Dr Stefano Gaul. Pt has upcoming appt on 09/01/2011 as New pt with vaginal pain. I called her, but she insists on speaking to AVS. I took [name] message and gave it to AVS who said he'd call her. Kristy [name]

## 2011-09-01 ENCOUNTER — Encounter: Payer: Self-pay | Admitting: Obstetrics and Gynecology

## 2011-09-01 ENCOUNTER — Ambulatory Visit (INDEPENDENT_AMBULATORY_CARE_PROVIDER_SITE_OTHER): Payer: PRIVATE HEALTH INSURANCE | Admitting: Obstetrics and Gynecology

## 2011-09-01 VITALS — BP 110/74 | Temp 97.9°F | Resp 16 | Ht 64.0 in | Wt 158.0 lb

## 2011-09-01 DIAGNOSIS — IMO0002 Reserved for concepts with insufficient information to code with codable children: Secondary | ICD-10-CM

## 2011-09-01 DIAGNOSIS — A6009 Herpesviral infection of other urogenital tract: Secondary | ICD-10-CM

## 2011-09-01 DIAGNOSIS — N952 Postmenopausal atrophic vaginitis: Secondary | ICD-10-CM

## 2011-09-01 DIAGNOSIS — A6000 Herpesviral infection of urogenital system, unspecified: Secondary | ICD-10-CM

## 2011-09-01 MED ORDER — ESTRADIOL 10 MCG VA TABS: 10.0000 ug | ORAL_TABLET | VAGINAL | Status: DC

## 2011-09-01 NOTE — Progress Notes (Signed)
HISTORY OF PRESENT ILLNESS  [name] is a 56 y.o. year old female,No obstetric history on file., who presents for a problem visit. The patient has a new partner.  She has not been essentially active for many years.  She tried to have intercourse and experienced pain and bleeding.  The patient had a positive herpes 2 serology.  Subjective:  The patient is very upset that her attempt at intercourse was uncomfortable.  Objective:  BP 110/74  Temp 97.9 F (36.6 C) (Oral)  Resp 16  Ht 5\' 4"  (1.626 m)  Wt 158 lb (71.668 kg)  BMI 27.12 kg/m2   Resp: clear to auscultation bilaterally Cardio: regular rate and rhythm, S1, S2 normal, no murmur, click, rub or gallop GI: soft, non-tender; bowel sounds normal; no masses,  no organomegaly  External genitalia: normal general appearance Vaginal: atrophic mucosa Cervix: normal appearance Adnexa: normal bimanual exam Uterus: small, nontender  Assessment:  Atrophic vaginitis Dyspareunia Herpes 2 Vaginal bleeding  Plan:  Menopausal changes and atrophic vaginitis discussed.  Will try Vagifem 10 g twice each week.  Vaginal lubricants discussed.  Herpes virus discussed.  Support offered.  Return to office in 3 month(s).   Leonard Schwartz M.D.  09/01/2011 11:01 AM

## 2014-11-14 DIAGNOSIS — E559 Vitamin D deficiency, unspecified: Secondary | ICD-10-CM | POA: Insufficient documentation

## 2015-03-25 ENCOUNTER — Other Ambulatory Visit: Payer: Self-pay | Admitting: Internal Medicine

## 2015-03-25 ENCOUNTER — Ambulatory Visit
Admission: RE | Admit: 2015-03-25 | Discharge: 2015-03-25 | Disposition: A | Discharge status: Home / Self Care | Payer: PRIVATE HEALTH INSURANCE | Source: Ambulatory Visit | Attending: Internal Medicine | Admitting: Internal Medicine

## 2015-03-25 DIAGNOSIS — M542 Cervicalgia: Secondary | ICD-10-CM

## 2016-02-04 ENCOUNTER — Other Ambulatory Visit: Payer: Self-pay | Admitting: Internal Medicine

## 2016-02-04 DIAGNOSIS — R42 Dizziness and giddiness: Secondary | ICD-10-CM

## 2016-03-10 ENCOUNTER — Ambulatory Visit
Admission: RE | Admit: 2016-03-10 | Discharge: 2016-03-10 | Disposition: A | Discharge status: Home / Self Care | Payer: PRIVATE HEALTH INSURANCE | Source: Ambulatory Visit | Attending: Internal Medicine | Admitting: Internal Medicine

## 2016-03-10 ENCOUNTER — Other Ambulatory Visit: Payer: Self-pay | Admitting: Internal Medicine

## 2016-03-10 DIAGNOSIS — R42 Dizziness and giddiness: Secondary | ICD-10-CM

## 2016-03-10 DIAGNOSIS — M25561 Pain in right knee: Secondary | ICD-10-CM

## 2016-03-10 MED ORDER — IOPAMIDOL (ISOVUE-300) INJECTION 61%
75.0000 mL | Freq: Once | INTRAVENOUS | Status: AC | PRN
  Administered 2016-03-10: 75 mL via INTRAVENOUS

## 2016-05-08 ENCOUNTER — Other Ambulatory Visit: Payer: Self-pay | Admitting: Internal Medicine

## 2016-05-08 DIAGNOSIS — Z1231 Encounter for screening mammogram for malignant neoplasm of breast: Secondary | ICD-10-CM

## 2016-06-02 ENCOUNTER — Ambulatory Visit
Admission: RE | Admit: 2016-06-02 | Discharge: 2016-06-02 | Disposition: A | Discharge status: Home / Self Care | Payer: PRIVATE HEALTH INSURANCE | Source: Ambulatory Visit | Attending: Internal Medicine | Admitting: Internal Medicine

## 2016-06-02 DIAGNOSIS — Z1231 Encounter for screening mammogram for malignant neoplasm of breast: Secondary | ICD-10-CM

## 2017-09-22 ENCOUNTER — Other Ambulatory Visit: Payer: Self-pay | Admitting: Internal Medicine

## 2017-09-22 DIAGNOSIS — Z1231 Encounter for screening mammogram for malignant neoplasm of breast: Secondary | ICD-10-CM

## 2017-11-01 ENCOUNTER — Ambulatory Visit
Admission: RE | Admit: 2017-11-01 | Discharge: 2017-11-01 | Disposition: A | Discharge status: Home / Self Care | Payer: PRIVATE HEALTH INSURANCE | Source: Ambulatory Visit | Attending: Internal Medicine | Admitting: Internal Medicine

## 2017-11-01 DIAGNOSIS — Z1231 Encounter for screening mammogram for malignant neoplasm of breast: Secondary | ICD-10-CM

## 2018-12-23 ENCOUNTER — Other Ambulatory Visit: Payer: Self-pay | Admitting: Internal Medicine

## 2018-12-23 DIAGNOSIS — Z1231 Encounter for screening mammogram for malignant neoplasm of breast: Secondary | ICD-10-CM

## 2019-01-10 ENCOUNTER — Other Ambulatory Visit: Payer: Self-pay

## 2019-01-10 DIAGNOSIS — Z20822 Contact with and (suspected) exposure to covid-19: Secondary | ICD-10-CM

## 2019-01-11 LAB — NOVEL CORONAVIRUS, NAA: SARS-CoV-2, NAA: NOT DETECTED

## 2019-02-20 ENCOUNTER — Ambulatory Visit: Payer: PRIVATE HEALTH INSURANCE

## 2019-04-03 ENCOUNTER — Ambulatory Visit: Payer: PRIVATE HEALTH INSURANCE

## 2019-11-10 ENCOUNTER — Other Ambulatory Visit: Payer: Self-pay | Admitting: Obstetrics and Gynecology

## 2019-11-23 DIAGNOSIS — N393 Stress incontinence (female) (male): Secondary | ICD-10-CM | POA: Diagnosis present

## 2019-11-23 NOTE — H&P (Addendum)
Kristy [name] is a 64 y.o. female, G:0, presents for placement of tension free vaginal tape because  of stress urinary incontinence and intrinsic sphincter dysfunction. For five years the patient reports urinary urgency and  loss of bladder control with coughing, sneezing or laughing.  She goes on to report frequency and nocturia but denies any dysuria, lower back pain, hematuria or history of renal stones.  She was recently placed on Mybetriq with some  relief from her urgency and frequency but stress incontinence symptoms remain.  The patient underwent Lumax Urodynamics and the diagnosis of stress incontinence and intrinsic sphincter dysfunction was confirmed.  A review of medical and surgical management options were given to the patient for stress urinary incontinence, however, she would like to proceed with surgical management.   Past Medical History  OB History: G:0  GYN History: menarche: 64 YO;   Denies history of abnormal PAP smear.   Last PAP smear: 2021-normal with negative HPV  Medical History: Sarcoidosis, Herpes Simplex 2, Left Wrist Strain,  Surgical History:  2008 Bronchoscopy with Biopsy (diagnosed with Sarcoidosis) Denies problems with anesthesia or history of blood transfusions  Family History: COPD, Prostate Cancer, Breast Cancer, Hypertension, Alzheimer's Disease and Diabetes Mellitus  Social History:  Single and Retired;  Denies tobacco or alcohol use   Medications: Meclizine 25 mg tid prn Flonase 50 mcg 1 spray per nostril daily ASA 81 mg daily Lasix 20 mg daily Vicodin 5.325 mg daily prn-headache Mybetriq Extended Release  25 mg daily Valacyclovir 500 mg bid x 3 days prn   No Known Allergies   Denies sensitivity to peanuts, shellfish, soy, latex or adhesives.   ROS: Admits to contact lenses, urinary urgency and incontinence but denies headache, vision changes, nasal congestion, dysphagia, tinnitus, dizziness, hoarseness, cough,  chest pain, shortness of  breath, nausea, vomiting, diarrhea,constipation,  urinary frequency,  dysuria, hematuria, vaginitis symptoms, pelvic pain, swelling of joints,easy bruising,  myalgias, arthralgias, skin rashes, unexplained weight loss and except as is mentioned in the history of present illness, patient's review of systems is otherwise negative.     Physical Exam  Bp: 128/62;  P: 83 bpm;  R: 20;  O2Sat. 96% (room air);  Weight: 190 lbs.  Height: 5\' 4"   BMI: 32.6  Neck: supple without masses or thyromegaly Lungs: clear to auscultation Heart: regular rate and rhythm Abdomen: soft, non-tender and no organomegaly Pelvic:EGBUS- wnl; vagina-atrophic; uterus-normal size, cervix without lesions or motion tenderness; adnexae-no tenderness or masses Extremities:  no clubbing, cyanosis or edema   Assesment: Female Stress Incontinence   Disposition:  A discussion was held with patient regarding the indication for her procedure(s) along with the risks, which include but are not limited to: reaction to anesthesia, damage to adjacent organs, infection, erosion of vaginal tape, excessive bleeding and worsening of symptoms. The patient verbalized understanding of these risks and has consented to proceed with Placement of Tension Free Vaginal Tape with Cystoscopy at [facility] on October 21. 2021.   CSN# [id number]   [place] J. [id number], PA-C  for Dr. Lowell Guitar. Woodroe Mode  R/b/a reviewed, questions answered and consent s/w.

## 2019-12-04 NOTE — Progress Notes (Signed)
[id number] [place], [place] - [phone] AT [facility] OF [address] [facility] [address] & [address] [place] [address] Phone: [phone] Fax: [phone]      Your procedure is scheduled on Thursday 12/07/2019.  Report to [facility] Main Entrance "A" at 05:30 A.M., and check in at the Admitting office.  Call this number if you have problems the morning of surgery:  [phone]  Call [phone] if you have any questions prior to your surgery date Monday-Friday 8am-4pm    Remember:  Do not eat after midnight the night before your surgery  You may drink clear liquids until 04:30 the morning of your surgery.   Clear liquids allowed are: Water, Non-Citrus Juices (without pulp), Carbonated Beverages, Clear Tea, Black Coffee Only, and Gatorade    Take these medicines the morning of surgery with A SIP OF WATER: Fluticasone (Flonase) nasal spray Mirabegron ER (Myrbetriq)  If needed you may take the following: Hydrocodone-acetaminophen (Norco/vicodin) levocetirizine (Xyzal) Meclizine (Antivert) Oxymetazoline (Afrin) nasal spray Valacyclovir (Valtrex)   As of today, STOP taking any Aspirin (unless otherwise instructed by your surgeon), aspirin-containing products, Aleve, Naproxen, Ibuprofen, Motrin, Advil, Goody's, BC's, all herbal medications, fish oil, and all vitamins.                      Do not wear jewelry, make up, or nail polish            Do not wear lotions, powders, perfumes, or deodorant.            Do not shave 48 hours prior to surgery.  Men may shave face and neck.            Do not bring valuables to the hospital.            [facility] is not responsible for any belongings or valuables.  Do NOT Smoke (Tobacco/Vaping) or drink Alcohol 24 hours prior to your procedure  If you use a CPAP at night, you may bring all equipment for your overnight stay.   Contacts, glasses, dentures or bridgework may not be worn into surgery.       For patients admitted to the hospital, discharge time will be determined by your treatment team.   Patients discharged the day of surgery will not be allowed to drive home, and someone needs to stay with them for 24 hours.    Special instructions:   [place]- Preparing For Surgery  Before surgery, you can play an important role. Because skin is not sterile, your skin needs to be as free of germs as possible. You can reduce the number of germs on your skin by washing with CHG (chlorahexidine gluconate) Soap before surgery.  CHG is an antiseptic cleaner which kills germs and bonds with the skin to continue killing germs even after washing.    Oral Hygiene is also important to reduce your risk of infection.  Remember - BRUSH YOUR TEETH THE MORNING OF SURGERY WITH YOUR REGULAR TOOTHPASTE  Please do not use if you have an allergy to CHG or antibacterial soaps. If your skin becomes reddened/irritated stop using the CHG.  Do not shave (including legs and underarms) for at least 48 hours prior to first CHG shower. It is OK to shave your face.  Please follow these instructions carefully.   1. Shower the NIGHT BEFORE SURGERY and the MORNING OF SURGERY with CHG Soap.   2. If you chose to wash your hair, wash  your hair first as usual with your normal shampoo.  3. After you shampoo, rinse your hair and body thoroughly to remove the shampoo.  4. Use CHG as you would any other liquid soap. You can apply CHG directly to the skin and wash gently with a scrungie or a clean washcloth.   5. Apply the CHG Soap to your body ONLY FROM THE NECK DOWN.  Do not use on open wounds or open sores. Avoid contact with your eyes, ears, mouth and genitals (private parts). Wash Face and genitals (private parts)  with your normal soap.   6. Wash thoroughly, paying special attention to the area where your surgery will be performed.  7. Thoroughly rinse your body with warm water from the neck down.  8. DO NOT  shower/wash with your normal soap after using and rinsing off the CHG Soap.  9. Pat yourself dry with a CLEAN TOWEL.  10. Wear CLEAN PAJAMAS to bed the night before surgery  11. Place CLEAN SHEETS on your bed the night of your first shower and DO NOT SLEEP WITH PETS.   Day of Surgery: Shower with CHG soap as directed Wear Clean/Comfortable clothing the morning of surgery Do not apply any deodorants/lotions.   Remember to brush your teeth WITH YOUR REGULAR TOOTHPASTE.   Please read over the following fact sheets that you were given.

## 2019-12-05 ENCOUNTER — Encounter (HOSPITAL_COMMUNITY): Payer: Self-pay

## 2019-12-05 ENCOUNTER — Other Ambulatory Visit (HOSPITAL_COMMUNITY)
Admission: RE | Admit: 2019-12-05 | Discharge: 2019-12-05 | Disposition: A | Discharge status: Home / Self Care | Payer: Medicare PPO | Source: Ambulatory Visit | Attending: Obstetrics and Gynecology | Admitting: Obstetrics and Gynecology

## 2019-12-05 ENCOUNTER — Other Ambulatory Visit: Payer: Self-pay

## 2019-12-05 ENCOUNTER — Encounter (HOSPITAL_COMMUNITY)
Admission: RE | Admit: 2019-12-05 | Discharge: 2019-12-05 | Disposition: A | Discharge status: Home / Self Care | Payer: Medicare PPO | Source: Ambulatory Visit | Attending: Obstetrics and Gynecology | Admitting: Obstetrics and Gynecology

## 2019-12-05 DIAGNOSIS — Z01812 Encounter for preprocedural laboratory examination: Secondary | ICD-10-CM | POA: Insufficient documentation

## 2019-12-05 DIAGNOSIS — Z20822 Contact with and (suspected) exposure to covid-19: Secondary | ICD-10-CM | POA: Insufficient documentation

## 2019-12-05 HISTORY — DX: Sarcoidosis, unspecified: D86.9

## 2019-12-05 HISTORY — DX: Stress incontinence (female) (male): N39.3

## 2019-12-05 LAB — BASIC METABOLIC PANEL
Anion gap: 7 (ref 5–15)
BUN: 8 mg/dL (ref 8–23)
CO2: 26 mmol/L (ref 22–32)
Calcium: 9.5 mg/dL (ref 8.9–10.3)
Chloride: 110 mmol/L (ref 98–111)
Creatinine, Ser: 0.78 mg/dL (ref 0.44–1.00)
GFR, Estimated: >60 mL/min (ref >60)
Glucose, Bld: 100 mg/dL — ABNORMAL HIGH (ref 70–99)
Potassium: 4 mmol/L (ref 3.5–5.1)
Sodium: 143 mmol/L (ref 135–145)

## 2019-12-05 LAB — TYPE AND SCREEN
ABO/RH(D): O POS
Antibody Screen: NEGATIVE

## 2019-12-05 LAB — CBC
HCT: 43.4 % (ref 36.0–46.0)
Hemoglobin: 14 g/dL (ref 12.0–15.0)
MCH: 28.3 pg (ref 26.0–34.0)
MCHC: 32.3 g/dL (ref 30.0–36.0)
MCV: 87.7 fL (ref 80.0–100.0)
Platelets: 305 10*3/uL (ref 150–400)
RBC: 4.95 MIL/uL (ref 3.87–5.11)
RDW: 11.4 % — ABNORMAL LOW (ref 11.5–15.5)
WBC: 3.3 10*3/uL — ABNORMAL LOW (ref 4.0–10.5)
nRBC: 0 % (ref 0.0–0.2)

## 2019-12-05 LAB — SARS CORONAVIRUS 2 (TAT 6-24 HRS): SARS Coronavirus 2: NEGATIVE

## 2019-12-05 NOTE — Progress Notes (Signed)
PCP - [facility] [place] Cardiologist - NA      EKG - NA r - s: Aspirin Instructions:STOP  ERAS Protcol -INSTRUCTIONS GIVEN  COVID TEST- FOR TODAY   Anesthesia review: NA  Patient denies shortness of breath, fever, cough and chest pain at PAT appointment   All instructions explained to the patient, with a verbal understanding of the material. Patient agrees to go over the instructions while at home for a better understanding. Patient also instructed to self quarantine after being tested for COVID-19. The opportunity to ask questions was provided.

## 2019-12-06 ENCOUNTER — Encounter (HOSPITAL_COMMUNITY): Payer: Self-pay | Admitting: Obstetrics and Gynecology

## 2019-12-06 NOTE — Anesthesia Preprocedure Evaluation (Addendum)
Anesthesia Evaluation  Patient identified by MRN, date of birth, ID band Patient awake    Reviewed: Allergy & Precautions, NPO status , Patient's Chart, lab work & pertinent test results  Airway Mallampati: II  TM Distance: >3 FB Neck ROM: Full    Dental no notable dental hx.    Pulmonary  sarcoidosis   Pulmonary exam normal breath sounds clear to auscultation       Cardiovascular Exercise Tolerance: Good negative cardio ROS Normal cardiovascular exam Rhythm:Regular Rate:Normal     Neuro/Psych negative neurological ROS  negative psych ROS   GI/Hepatic negative GI ROS, Neg liver ROS,   Endo/Other  Obesity BMI 32   Renal/GU negative Renal ROS  Female GU complaint     Musculoskeletal negative musculoskeletal ROS (+)   Abdominal   Peds negative pediatric ROS (+)  Hematology negative hematology ROS (+)   Anesthesia Other Findings   Reproductive/Obstetrics negative OB ROS                            Anesthesia Physical Anesthesia Plan  ASA: II  Anesthesia Plan: General   Post-op Pain Management:    Induction: Intravenous  PONV Risk Score and Plan: 3 and Ondansetron, Dexamethasone, Midazolam and Treatment may vary due to age or medical condition  Airway Management Planned: LMA  Additional Equipment:   Intra-op Plan:   Post-operative Plan: Extubation in OR  Informed Consent: I have reviewed the patients History and Physical, chart, labs and discussed the procedure including the risks, benefits and alternatives for the proposed anesthesia with the patient or authorized representative who has indicated his/her understanding and acceptance.       Plan Discussed with: CRNA and Anesthesiologist  Anesthesia Plan Comments:        Anesthesia Quick Evaluation

## 2019-12-07 ENCOUNTER — Encounter (HOSPITAL_COMMUNITY)
Admission: RE | Disposition: A | Discharge status: Home / Self Care | Payer: Self-pay | Source: Home / Self Care | Attending: Obstetrics and Gynecology

## 2019-12-07 ENCOUNTER — Encounter (HOSPITAL_COMMUNITY): Payer: Self-pay | Admitting: Obstetrics and Gynecology

## 2019-12-07 ENCOUNTER — Ambulatory Visit (HOSPITAL_COMMUNITY): Payer: Medicare PPO | Admitting: Anesthesiology

## 2019-12-07 ENCOUNTER — Other Ambulatory Visit: Payer: Self-pay

## 2019-12-07 ENCOUNTER — Observation Stay (HOSPITAL_COMMUNITY)
Admission: RE | Admit: 2019-12-07 | Discharge: 2019-12-08 | Disposition: A | Discharge status: Home / Self Care | Payer: Medicare PPO | Attending: Obstetrics and Gynecology | Admitting: Obstetrics and Gynecology

## 2019-12-07 DIAGNOSIS — N393 Stress incontinence (female) (male): Secondary | ICD-10-CM | POA: Diagnosis not present

## 2019-12-07 HISTORY — PX: CYSTOSCOPY: SHX5120

## 2019-12-07 HISTORY — PX: BLADDER SUSPENSION: SHX72

## 2019-12-07 SURGERY — URETHROPEXY, USING TRANSVAGINAL TAPE
Anesthesia: General

## 2019-12-07 MED ORDER — LACTATED RINGERS IV SOLN: INTRAVENOUS | Status: DC

## 2019-12-07 MED ORDER — IBUPROFEN 600 MG PO TABS: 600.0000 mg | ORAL_TABLET | Freq: Four times a day (QID) | ORAL | Status: DC

## 2019-12-07 MED ORDER — SODIUM CHLORIDE (PF) 0.9 % IJ SOLN
INTRAMUSCULAR | Status: AC
  Filled 2019-12-07: qty 50

## 2019-12-07 MED ORDER — PHENYLEPHRINE 40 MCG/ML (10ML) SYRINGE FOR IV PUSH (FOR BLOOD PRESSURE SUPPORT)
PREFILLED_SYRINGE | INTRAVENOUS | Status: AC
  Filled 2019-12-07: qty 10

## 2019-12-07 MED ORDER — POVIDONE-IODINE 10 % EX SWAB: 2.0000 "application " | Freq: Once | CUTANEOUS | Status: DC

## 2019-12-07 MED ORDER — ACETAMINOPHEN 325 MG PO TABS
650.0000 mg | ORAL_TABLET | Freq: Four times a day (QID) | ORAL | Status: DC
  Administered 2019-12-07 – 2019-12-08 (×4): 650 mg via ORAL
  Filled 2019-12-07 (×4): qty 2

## 2019-12-07 MED ORDER — PHENYLEPHRINE 40 MCG/ML (10ML) SYRINGE FOR IV PUSH (FOR BLOOD PRESSURE SUPPORT)
PREFILLED_SYRINGE | INTRAVENOUS | Status: DC | PRN
  Administered 2019-12-07 (×5): 80 ug via INTRAVENOUS

## 2019-12-07 MED ORDER — ASPIRIN EC 81 MG PO TBEC
81.0000 mg | DELAYED_RELEASE_TABLET | Freq: Every day | ORAL | Status: DC
  Administered 2019-12-07 – 2019-12-08 (×2): 81 mg via ORAL
  Filled 2019-12-07 (×2): qty 1

## 2019-12-07 MED ORDER — PROPOFOL 10 MG/ML IV BOLUS
INTRAVENOUS | Status: AC
  Filled 2019-12-07: qty 20

## 2019-12-07 MED ORDER — SODIUM CHLORIDE (PF) 0.9 % IJ SOLN
INTRAMUSCULAR | Status: DC | PRN
  Administered 2019-12-07: 100 mL

## 2019-12-07 MED ORDER — OXYCODONE HCL 5 MG/5ML PO SOLN: 5.0000 mg | Freq: Four times a day (QID) | ORAL | Status: DC | PRN

## 2019-12-07 MED ORDER — ESTRADIOL 0.1 MG/GM VA CREA
TOPICAL_CREAM | VAGINAL | Status: DC | PRN
  Administered 2019-12-07: 1 via VAGINAL

## 2019-12-07 MED ORDER — KETOROLAC TROMETHAMINE 15 MG/ML IJ SOLN
15.0000 mg | Freq: Once | INTRAMUSCULAR | Status: AC
  Administered 2019-12-07: 15 mg via INTRAVENOUS
  Filled 2019-12-07: qty 1

## 2019-12-07 MED ORDER — ESTRADIOL 0.1 MG/GM VA CREA
TOPICAL_CREAM | VAGINAL | Status: AC
  Filled 2019-12-07: qty 42.5

## 2019-12-07 MED ORDER — CEFAZOLIN SODIUM-DEXTROSE 2-4 GM/100ML-% IV SOLN
2.0000 g | INTRAVENOUS | Status: AC
  Administered 2019-12-07: 2 g via INTRAVENOUS
  Filled 2019-12-07: qty 100

## 2019-12-07 MED ORDER — ONDANSETRON HCL 4 MG/2ML IJ SOLN: 4.0000 mg | Freq: Four times a day (QID) | INTRAMUSCULAR | Status: DC | PRN

## 2019-12-07 MED ORDER — HYDROCODONE-ACETAMINOPHEN 5-325 MG PO TABS
1.0000 | ORAL_TABLET | Freq: Four times a day (QID) | ORAL | Status: DC | PRN
  Administered 2019-12-07: 1 via ORAL
  Filled 2019-12-07: qty 1

## 2019-12-07 MED ORDER — VASOPRESSIN 20 UNIT/ML IV SOLN
INTRAVENOUS | Status: DC | PRN
  Administered 2019-12-07: 20 [IU]

## 2019-12-07 MED ORDER — DEXAMETHASONE SODIUM PHOSPHATE 4 MG/ML IJ SOLN
INTRAMUSCULAR | Status: DC | PRN
  Administered 2019-12-07: 4 mg via INTRAVENOUS

## 2019-12-07 MED ORDER — PROMETHAZINE HCL 25 MG/ML IJ SOLN
6.2500 mg | INTRAMUSCULAR | Status: DC | PRN
  Administered 2019-12-07: 12.5 mg via INTRAVENOUS

## 2019-12-07 MED ORDER — GLYCOPYRROLATE PF 0.2 MG/ML IJ SOSY
PREFILLED_SYRINGE | INTRAMUSCULAR | Status: AC
  Filled 2019-12-07: qty 1

## 2019-12-07 MED ORDER — GLYCOPYRROLATE PF 0.2 MG/ML IJ SOSY
PREFILLED_SYRINGE | INTRAMUSCULAR | Status: DC | PRN
  Administered 2019-12-07: .2 mg via INTRAVENOUS

## 2019-12-07 MED ORDER — PROPOFOL 10 MG/ML IV BOLUS
INTRAVENOUS | Status: DC | PRN
  Administered 2019-12-07: 150 mg via INTRAVENOUS

## 2019-12-07 MED ORDER — KETOROLAC TROMETHAMINE 30 MG/ML IJ SOLN: 30.0000 mg | Freq: Four times a day (QID) | INTRAMUSCULAR | Status: DC

## 2019-12-07 MED ORDER — INDOCYANINE GREEN 25 MG IV SOLR
INTRAVENOUS | Status: AC
  Filled 2019-12-07: qty 10

## 2019-12-07 MED ORDER — KETOROLAC TROMETHAMINE 15 MG/ML IJ SOLN
INTRAMUSCULAR | Status: DC | PRN
  Administered 2019-12-07: 15 mg via INTRAVENOUS

## 2019-12-07 MED ORDER — ONDANSETRON HCL 4 MG PO TABS: 4.0000 mg | ORAL_TABLET | Freq: Four times a day (QID) | ORAL | Status: DC | PRN

## 2019-12-07 MED ORDER — MIDAZOLAM HCL 2 MG/2ML IJ SOLN
INTRAMUSCULAR | Status: AC
  Filled 2019-12-07: qty 2

## 2019-12-07 MED ORDER — LIDOCAINE 2% (20 MG/ML) 5 ML SYRINGE
INTRAMUSCULAR | Status: AC
  Filled 2019-12-07: qty 5

## 2019-12-07 MED ORDER — FENTANYL CITRATE (PF) 100 MCG/2ML IJ SOLN
25.0000 ug | INTRAMUSCULAR | Status: DC | PRN
  Administered 2019-12-07: 25 ug via INTRAVENOUS

## 2019-12-07 MED ORDER — ONDANSETRON HCL 4 MG/2ML IJ SOLN
INTRAMUSCULAR | Status: AC
  Filled 2019-12-07: qty 2

## 2019-12-07 MED ORDER — MIDAZOLAM HCL 5 MG/5ML IJ SOLN
INTRAMUSCULAR | Status: DC | PRN
  Administered 2019-12-07: 2 mg via INTRAVENOUS

## 2019-12-07 MED ORDER — KETOROLAC TROMETHAMINE 30 MG/ML IJ SOLN
30.0000 mg | Freq: Four times a day (QID) | INTRAMUSCULAR | Status: AC
  Administered 2019-12-07: 30 mg via INTRAVENOUS
  Filled 2019-12-07: qty 1

## 2019-12-07 MED ORDER — MENTHOL 3 MG MT LOZG: 1.0000 | LOZENGE | OROMUCOSAL | Status: DC | PRN

## 2019-12-07 MED ORDER — FENTANYL CITRATE (PF) 100 MCG/2ML IJ SOLN
INTRAMUSCULAR | Status: AC
  Administered 2019-12-07: 25 ug via INTRAVENOUS
  Filled 2019-12-07: qty 2

## 2019-12-07 MED ORDER — FENTANYL CITRATE (PF) 250 MCG/5ML IJ SOLN
INTRAMUSCULAR | Status: AC
  Filled 2019-12-07: qty 5

## 2019-12-07 MED ORDER — FENTANYL CITRATE (PF) 250 MCG/5ML IJ SOLN
INTRAMUSCULAR | Status: DC | PRN
Start: 2019-12-07 — End: 2019-12-07
  Administered 2019-12-07 (×2): 25 ug via INTRAVENOUS
  Administered 2019-12-07: 50 ug via INTRAVENOUS

## 2019-12-07 MED ORDER — LIDOCAINE 2% (20 MG/ML) 5 ML SYRINGE
INTRAMUSCULAR | Status: DC | PRN
  Administered 2019-12-07: 80 mg via INTRAVENOUS

## 2019-12-07 MED ORDER — MECLIZINE HCL 25 MG PO TABS
25.0000 mg | ORAL_TABLET | Freq: Three times a day (TID) | ORAL | Status: DC | PRN
  Filled 2019-12-07: qty 1

## 2019-12-07 MED ORDER — ONDANSETRON HCL 4 MG/2ML IJ SOLN
INTRAMUSCULAR | Status: DC | PRN
  Administered 2019-12-07: 4 mg via INTRAVENOUS

## 2019-12-07 MED ORDER — 0.9 % SODIUM CHLORIDE (POUR BTL) OPTIME
TOPICAL | Status: DC | PRN
  Administered 2019-12-07: 1000 mL

## 2019-12-07 MED ORDER — VASOPRESSIN 20 UNIT/ML IV SOLN
INTRAVENOUS | Status: AC
  Filled 2019-12-07: qty 1

## 2019-12-07 MED ORDER — SODIUM CHLORIDE 0.9 % IR SOLN
Status: DC | PRN
  Administered 2019-12-07: 1000 mL via INTRAVESICAL

## 2019-12-07 MED ORDER — ORAL CARE MOUTH RINSE: 15.0000 mL | Freq: Once | OROMUCOSAL | Status: AC

## 2019-12-07 MED ORDER — IBUPROFEN 600 MG PO TABS
600.0000 mg | ORAL_TABLET | Freq: Four times a day (QID) | ORAL | Status: DC
  Administered 2019-12-08 (×2): 600 mg via ORAL
  Filled 2019-12-07 (×2): qty 1

## 2019-12-07 MED ORDER — DOCUSATE SODIUM 100 MG PO CAPS
100.0000 mg | ORAL_CAPSULE | Freq: Two times a day (BID) | ORAL | Status: DC
  Administered 2019-12-07 – 2019-12-08 (×3): 100 mg via ORAL
  Filled 2019-12-07 (×3): qty 1

## 2019-12-07 MED ORDER — KETOROLAC TROMETHAMINE 30 MG/ML IJ SOLN
INTRAMUSCULAR | Status: AC
  Filled 2019-12-07: qty 1

## 2019-12-07 MED ORDER — STERILE WATER FOR IRRIGATION IR SOLN
Status: DC | PRN
  Administered 2019-12-07: 5 mL via INTRAVESICAL

## 2019-12-07 MED ORDER — DEXAMETHASONE SODIUM PHOSPHATE 10 MG/ML IJ SOLN
INTRAMUSCULAR | Status: AC
  Filled 2019-12-07: qty 1

## 2019-12-07 MED ORDER — PROMETHAZINE HCL 25 MG/ML IJ SOLN
INTRAMUSCULAR | Status: AC
  Filled 2019-12-07: qty 1

## 2019-12-07 MED ORDER — KETOROLAC TROMETHAMINE 30 MG/ML IJ SOLN: 15.0000 mg | Freq: Four times a day (QID) | INTRAMUSCULAR | Status: DC

## 2019-12-07 MED ORDER — SIMETHICONE 80 MG PO CHEW: 80.0000 mg | CHEWABLE_TABLET | Freq: Four times a day (QID) | ORAL | Status: DC | PRN

## 2019-12-07 MED ORDER — CHLORHEXIDINE GLUCONATE 0.12 % MT SOLN
15.0000 mL | Freq: Once | OROMUCOSAL | Status: AC
  Administered 2019-12-07: 15 mL via OROMUCOSAL
  Filled 2019-12-07: qty 15

## 2019-12-07 SURGICAL SUPPLY — 35 items
ADH SKN CLS APL DERMABOND .7 (GAUZE/BANDAGES/DRESSINGS) ×1
ADH SKN CLS LQ APL DERMABOND (GAUZE/BANDAGES/DRESSINGS) ×1
BLADE SURG 11 STRL SS (BLADE) ×3 IMPLANT
BLADE SURG 15 STRL LF DISP TIS (BLADE) ×1 IMPLANT
BLADE SURG 15 STRL SS (BLADE) ×3
CANISTER SUCT 3000ML PPV (MISCELLANEOUS) ×3 IMPLANT
CATH FOLEY 2WAY SLVR  5CC 18FR (CATHETERS) ×3
CATH FOLEY 2WAY SLVR 5CC 18FR (CATHETERS) ×1 IMPLANT
CATH ROBINSON RED A/P 16FR (CATHETERS) ×3 IMPLANT
COVER WAND RF STERILE (DRAPES) ×3 IMPLANT
DECANTER SPIKE VIAL GLASS SM (MISCELLANEOUS) ×2 IMPLANT
DERMABOND ADHESIVE PROPEN (GAUZE/BANDAGES/DRESSINGS) ×2
DERMABOND ADVANCED (GAUZE/BANDAGES/DRESSINGS) ×2
DERMABOND ADVANCED .7 DNX12 (GAUZE/BANDAGES/DRESSINGS) ×1 IMPLANT
DERMABOND ADVANCED .7 DNX6 (GAUZE/BANDAGES/DRESSINGS) IMPLANT
GAUZE PACKING 1 X5 YD ST (GAUZE/BANDAGES/DRESSINGS) ×2 IMPLANT
GLOVE BIO SURGEON STRL SZ7.5 (GLOVE) ×3 IMPLANT
GLOVE BIOGEL PI IND STRL 7.0 (GLOVE) ×1 IMPLANT
GLOVE BIOGEL PI IND STRL 7.5 (GLOVE) ×1 IMPLANT
GLOVE BIOGEL PI INDICATOR 7.0 (GLOVE) ×2
GLOVE BIOGEL PI INDICATOR 7.5 (GLOVE) ×2
GOWN STRL REUS W/ TWL LRG LVL3 (GOWN DISPOSABLE) ×2 IMPLANT
GOWN STRL REUS W/TWL LRG LVL3 (GOWN DISPOSABLE) ×6
KIT TURNOVER KIT B (KITS) ×3 IMPLANT
NEEDLE HYPO 22GX1.5 SAFETY (NEEDLE) ×3 IMPLANT
NS IRRIG 1000ML POUR BTL (IV SOLUTION) ×3 IMPLANT
PACK VAGINAL WOMENS (CUSTOM PROCEDURE TRAY) ×3 IMPLANT
SET CYSTO W/LG BORE CLAMP LF (SET/KITS/TRAYS/PACK) ×3 IMPLANT
SLING TRANS VAGINAL TAPE (Sling) ×2 IMPLANT
SLING UTERINE/ABD GYNECARE TVT (Sling) ×2 IMPLANT
SUT VIC AB 2-0 SH 27 (SUTURE) ×3
SUT VIC AB 2-0 SH 27XBRD (SUTURE) ×1 IMPLANT
TOWEL GREEN STERILE FF (TOWEL DISPOSABLE) ×6 IMPLANT
TRAY FOLEY W/BAG SLVR 16FR (SET/KITS/TRAYS/PACK) ×3
TRAY FOLEY W/BAG SLVR 16FR ST (SET/KITS/TRAYS/PACK) ×1 IMPLANT

## 2019-12-07 NOTE — Op Note (Signed)
Preop Diagnosis: Stress Urinary Incontinence   Postop Diagnosis: Stress Urinary Incontinence   Procedure: TRANSVAGINAL TAPE (TVT) PROCEDURE CYSTOSCOPY   Anesthesia: Choice   Anesthesiologist: Dr. Donavan Foil   Attending: Osborn Coho, MD   Assistant: [name], PA-C  Findings: Atrophic vagina  Pathology: N/a  Fluids: 800 cc  UOP: 75 cc  EBL: 75 cc  Complications: None  Procedure: The patient was taken to the operating room after the risks, benefits and alternatives were discussed with patient, the patient verbalized understanding and consent signed and witnessed. The patient was placed under general anesthesia and prepped and draped in the normal sterile fashion in the dorsal lithotomy position. A weighted speculum was placed in the patient's vagina and the anterior vaginal wall was injected with dilute pitressin at a concentration of 20 units of pitressin in a total of 100cc of normal saline.  An incision was made in the anterior wall of the vagina for approximately 1cm beneath the midurethra and the underlying tissue was dissected away from the anterior vaginal wall down to the level of the lower symphysis pubis bilaterally. Attention was then turned to the mons pubis where two 5 mm incisions were made 2 fingerbreadths from the midline. The transabdominal guide was then passed through the mons pubis incision on the patient's right down through the space of Retzius and out through the anterior vaginal wall after deflecting the rigid urethral catheter guide to the ipsilateral side. The same was done on the contralateral side. Cystoscopy was performed and no invadvertant bladder injury was noted. The bladder was drained with a Foley while deflecting the rigid urethral catheter guide to the patient's right and the mesh was attached to the transabdominal guide and elevated up through the space of Retzius and out through the incision on the mons pubis on the ipsilateral side. The same  was done on the contralateral side. Cystoscopy was performed again and no inadvertant bladder injury was noted. The 47 French Foley was left in the urethra and a large Tresa Endo was placed between the urethra and the mesh in order to leave the mesh slack beneath the midurethra. The mesh was then cut flush with the skin at the mons pubis incisions bilaterally. Indigo carmine had been administered, cystoscopy was performed again and bilateral ureters were noted to efflux without difficulty. The bilateral incisions on the mons pubis were then cleaned and Dermabond applied. The anterior vaginal wall incision was repaired with 2-0 vicryl with running interlocking stitch.  Vagina was packed with estrogen soaked vaginal packing.  Sponge, lap and needle count was correct.  The patient tolerated the procedure well and was returned to the recovery room in good condition.

## 2019-12-07 NOTE — Anesthesia Postprocedure Evaluation (Signed)
Anesthesia Post Note  Patient: Kristy [name]  Procedure(s) Performed: TRANSVAGINAL TAPE (TVT) PROCEDURE (N/A ) CYSTOSCOPY (N/A )     Patient location during evaluation: PACU Anesthesia Type: General Level of consciousness: sedated Pain management: pain level controlled Vital Signs Assessment: post-procedure vital signs reviewed and stable Respiratory status: spontaneous breathing and respiratory function stable Cardiovascular status: stable Postop Assessment: no apparent nausea or vomiting Anesthetic complications: no   No complications documented.  Last Vitals:  Vitals:   12/07/19 1015 12/07/19 1030  BP: 110/65 127/81  Pulse: 73 74  Resp: 10 11  Temp:    SpO2: 95% 98%    Last Pain:  Vitals:   12/07/19 1030  TempSrc:   PainSc: Asleep                 Mellody Dance

## 2019-12-07 NOTE — Transfer of Care (Signed)
Immediate Anesthesia Transfer of Care Note  Patient: Kristy [name]  Procedure(s) Performed: TRANSVAGINAL TAPE (TVT) PROCEDURE (N/A ) CYSTOSCOPY (N/A )  Patient Location: PACU  Anesthesia Type:General  Level of Consciousness: drowsy, patient cooperative and responds to stimulation  Airway & Oxygen Therapy: Patient Spontanous Breathing and Patient connected to face mask oxygen  Post-op Assessment: Report given to RN and Post -op Vital signs reviewed and stable  Post vital signs: Reviewed and stable  Last Vitals:  Vitals Value Taken Time  BP 124/72 12/07/19 0948  Temp    Pulse 81 12/07/19 0948  Resp 11 12/07/19 0948  SpO2 98 % 12/07/19 0948  Vitals shown include unvalidated device data.  Last Pain:  Vitals:   12/07/19 0634  TempSrc:   PainSc: 0-No pain      Patients Stated Pain Goal: 2 (12/07/19 1007)  Complications: No complications documented.

## 2019-12-07 NOTE — Anesthesia Procedure Notes (Addendum)
Procedure Name: LMA Insertion Date/Time: 12/07/2019 7:43 AM Performed by: Shary Decamp, CRNA Pre-anesthesia Checklist: Patient identified, Patient being monitored, Timeout performed, Emergency Drugs available and Suction available Patient Re-evaluated:Patient Re-evaluated prior to induction Oxygen Delivery Method: Circle system utilized Preoxygenation: Pre-oxygenation with 100% oxygen Induction Type: IV induction Ventilation: Mask ventilation without difficulty LMA: LMA inserted LMA Size: 4.0 Placement Confirmation: positive ETCO2 and breath sounds checked- equal and bilateral Tube secured with: Tape Dental Injury: Teeth and Oropharynx as per pre-operative assessment

## 2019-12-07 NOTE — OR Nursing (Signed)
Pt is awake,alert and oriented.Pt and/or family verbalized understanding of poc and discharge instructions. Reviewed admission and on going care with receiving RN. Pt is in NAD at this time and is ready to be transferred to floor. Will con't to monitor until pt is transferred. Belongings on bed with patient Pt is sleepy but awakens to verbal command and alert when up.

## 2019-12-08 ENCOUNTER — Encounter (HOSPITAL_COMMUNITY): Payer: Self-pay | Admitting: Obstetrics and Gynecology

## 2019-12-08 DIAGNOSIS — N393 Stress incontinence (female) (male): Secondary | ICD-10-CM | POA: Diagnosis not present

## 2019-12-08 LAB — CBC
HCT: 35.5 % — ABNORMAL LOW (ref 36.0–46.0)
Hemoglobin: 11.7 g/dL — ABNORMAL LOW (ref 12.0–15.0)
MCH: 28.8 pg (ref 26.0–34.0)
MCHC: 33 g/dL (ref 30.0–36.0)
MCV: 87.4 fL (ref 80.0–100.0)
Platelets: 255 10*3/uL (ref 150–400)
RBC: 4.06 MIL/uL (ref 3.87–5.11)
RDW: 11.6 % (ref 11.5–15.5)
WBC: 11 10*3/uL — ABNORMAL HIGH (ref 4.0–10.5)
nRBC: 0 % (ref 0.0–0.2)

## 2019-12-08 LAB — BASIC METABOLIC PANEL
Anion gap: 7 (ref 5–15)
BUN: 11 mg/dL (ref 8–23)
CO2: 26 mmol/L (ref 22–32)
Calcium: 8.9 mg/dL (ref 8.9–10.3)
Chloride: 108 mmol/L (ref 98–111)
Creatinine, Ser: 0.79 mg/dL (ref 0.44–1.00)
GFR, Estimated: >60 mL/min (ref >60)
Glucose, Bld: 116 mg/dL — ABNORMAL HIGH (ref 70–99)
Potassium: 4 mmol/L (ref 3.5–5.1)
Sodium: 141 mmol/L (ref 135–145)

## 2019-12-08 MED ORDER — CIPROFLOXACIN HCL 250 MG PO TABS: ORAL_TABLET | ORAL | 0 refills | Status: DC

## 2019-12-08 MED ORDER — OXYCODONE HCL 5 MG PO TABS: ORAL_TABLET | ORAL | 0 refills | Status: DC

## 2019-12-08 MED ORDER — IBUPROFEN 600 MG PO TABS: ORAL_TABLET | ORAL | 1 refills | Status: DC

## 2019-12-08 MED ORDER — HYDROCODONE-ACETAMINOPHEN 5-325 MG PO TABS
1.0000 | ORAL_TABLET | Freq: Four times a day (QID) | ORAL | 0 refills | Status: DC | PRN
Start: 2019-12-08 — End: 2023-03-18

## 2019-12-08 MED ORDER — FLUCONAZOLE 150 MG PO TABS: 150.0000 mg | ORAL_TABLET | Freq: Once | ORAL | 1 refills | Status: AC

## 2019-12-08 NOTE — Progress Notes (Signed)
Pt discharged to home with sister.  Condition stable.  Pt ambulated to car at [employer] entrance with RN.  No equipment for home ordered at discharge.

## 2019-12-08 NOTE — Discharge Instructions (Signed)
Call [facility] @ [phone] if:  You have a temperature greater than or equal to 100.4 degrees Farenheit orally You have pain that is not made better by the pain medication given and taken as directed You have excessive bleeding or problems urinating  Take Colace (Docusate Sodium/Stool Softener) 100 mg 2-3 times daily while taking narcotic pain medicine to avoid constipation or until bowel movements are regular.  Take  Ciprofloxacin 250 mg  twice a day for 3 days  Take your Ibuprofen 600 mg with food every 6 hours for 5 days then as needed for pain  You may drive after 2  weeks You may walk up steps  You may shower  You may resume a regular diet  Keep incisions clean and dry Do not lift over 15 pounds for 6 weeks Avoid anything in vagina for 6 weeks (or until after your post-operative visit)

## 2019-12-08 NOTE — Progress Notes (Signed)
Patient has voided twice this morning since vaginal packing and urinary catheter were removed.  Each void was 350 ml and patient reports feeling she emptied her bladder with each void.  Phoned Dr. Su Hilt with update.  She will come see patient after office hours this morning and anticipates discharging patient to home.

## 2019-12-08 NOTE — Final Progress Note (Signed)
Packing was removed with very scant bleeding on the gauze. Pt tolerated well. Foley was removed as well after. Will continue to monitor pt.

## 2019-12-08 NOTE — Progress Notes (Addendum)
Kristy [name] is a64 y.o.  [id number]  Post Op Date # 1: Placement of TVT  Subjective: Patient is Doing well postoperatively. Patient has Pain is controlled with current analgesics. Medications being used: prescription NSAID's including IIbuprofen 600 mg and narcotic analgesics including hydrocodone 5 mg as needed. Patient is ambulating in the halls without dizziness, tolerating a regular diet, but has not voided yet.   Objective: Vital signs in last 24 hours: Temp:  [97.5 F (36.4 C)-98 F (36.7 C)] 98 F (36.7 C) (10/22 0417) Pulse Rate:  [66-84] 84 (10/22 0417) Resp:  [7-19] 16 (10/22 0417) BP: (92-136)/(25-81) 124/50 (10/22 0417) SpO2:  [88 %-100 %] 98 % (10/22 0417)  Intake/Output from previous day: 10/21 0701 - 10/22 0700 In: 2170.7 [P.O.:720; I.V.:1050.7] Out: 1725 [Urine:1650] Intake/Output this shift: No intake/output data recorded. Recent Labs  Lab 12/05/19 0930 12/08/19 0511  WBC 3.3* 11.0*  HGB 14.0 11.7*  HCT 43.4 35.5*  PLT 305 255     Recent Labs  Lab 12/05/19 0930 12/08/19 0511  NA 143 141  K 4.0 4.0  CL 110 108  CO2 26 26  BUN 8 11  CREATININE 0.78 0.79  CALCIUM 9.5 8.9  GLUCOSE 100* 116*    EXAM: General: alert, cooperative and no distress Resp: clear to auscultation bilaterally Cardio: regular rate and rhythm, S1, S2 normal, no murmur, click, rub or gallop GI: bowel sounds present, soft and mons incisions are intact with no evidence of infection Extremities: Homans sign is negative, no sign of DVT and no calf tenderness.   Assessment: s/p Procedure(s): TRANSVAGINAL TAPE (TVT) PROCEDURE CYSTOSCOPY: stable, progressing well and tolerating diet  Plan: Encourage ambulation Awaiting the patient to void.  Will catheterize if the patient has not voided by 9:30 a.m. (in & out) -RN, Raynelle Fanning advised and order placed. Consider discharge home today.  LOS: 0 days    [name], PA-C 12/08/2019 7:43 AM  Pt reports doing well.  Voiding  without difficulty and feels like she is emptying and no leaking.  She has voided 4 times and for the first 2 times, report from [place] her nurse, the pt voided about 350cc each time.  Incisions c/d/ and dermabond intact, reports minimal vaginal bleeding/spotting, pain adequately controlled, tolerating po without any N/V and ambulating without difficulty.  D/c instructions reviewed with the patient and questions answered.  Pt instructed to f/u in the office for post op appt in 6wks.

## 2019-12-08 NOTE — Discharge Summary (Signed)
Physician Discharge Summary  Patient ID: Kristy [name] MRN: [id number] DOB/AGE: [id number] 65 y.o.  Admit date: 12/07/2019 Discharge date: 12/08/2019   Discharge Diagnoses:  Active Problems:   Female stress incontinence   Operation: Placement of Tension Free Vaginal Tape and Cystoscopy   Discharged Condition: Good  Hospital Course: On the date of admission the patient underwent the aforementioned procedures and tolerated them well.  Post operative course was unremarkable with the patient resuming bowel and bladder function by post operative day #1 and was therefore deemed ready for discharge home.  Discharge hemoglobin was 11.7.  Discharge instructions reviewed.  Disposition:   Discharge Medications:  Allergies as of 12/08/2019   No Known Allergies     Medication List    STOP taking these medications   naproxen sodium 220 MG tablet Commonly known as: ALEVE     TAKE these medications   ALKA-SELTZER ORIGINAL PO Take 2 tablets by mouth daily as needed (allergies).   aspirin EC 81 MG tablet Take 81 mg by mouth daily. Swallow whole.   cholecalciferol 25 MCG (1000 UNIT) tablet Commonly known as: VITAMIN D3 Take 1,000 Units by mouth daily.   ciprofloxacin 250 MG tablet Commonly known as: CIPRO take 1 tablet po bid x 3 days   fluconazole 150 MG tablet Commonly known as: Diflucan Take 1 tablet (150 mg total) by mouth once for 1 dose. As needed   fluticasone 50 MCG/ACT nasal spray Commonly known as: FLONASE Place 1 spray into both nostrils daily.   HYDROcodone-acetaminophen 5-325 MG tablet Commonly known as: NORCO/VICODIN Take 1 tablet by mouth every 6 (six) hours as needed for moderate pain or severe pain. What changed:   when to take this  reasons to take this   ibuprofen 600 MG tablet Commonly known as: ADVIL take 1 tablet po pc every 6 hours for 5 days then prn-post operative pain   levocetirizine 5 MG tablet Commonly known as: XYZAL Take 5 mg by  mouth daily as needed for allergies.   meclizine 25 MG tablet Commonly known as: ANTIVERT Take 25 mg by mouth 3 (three) times daily as needed for dizziness.   Myrbetriq 25 MG Tb24 tablet Generic drug: mirabegron ER Take 25 mg by mouth daily.   OVER THE COUNTER MEDICATION Apply 1 application topically daily as needed (pain). magn[place]port balm   oxymetazoline 0.05 % nasal spray Commonly known as: AFRIN Place 1 spray into both nostrils daily as needed for congestion.   PRESCRIPTION MEDICATION Inject 1 Dose as directed every Monday. Lipotropic injection   valACYclovir 500 MG tablet Commonly known as: VALTREX Take 500 mg by mouth daily as needed (fever blisters).          Follow-up: Dr. Woodroe Mode. Su Hilt on January 17 2020 at 9:45 a.m.     Signed: Purcell Nails, PA-C 12/08/2019, 1:32 PM

## 2020-06-04 DIAGNOSIS — R3989 Other symptoms and signs involving the genitourinary system: Secondary | ICD-10-CM | POA: Diagnosis not present

## 2020-07-18 DIAGNOSIS — N390 Urinary tract infection, site not specified: Secondary | ICD-10-CM | POA: Diagnosis not present

## 2020-11-20 DIAGNOSIS — N952 Postmenopausal atrophic vaginitis: Secondary | ICD-10-CM | POA: Diagnosis not present

## 2020-11-20 DIAGNOSIS — R3989 Other symptoms and signs involving the genitourinary system: Secondary | ICD-10-CM | POA: Diagnosis not present

## 2020-11-21 DIAGNOSIS — R350 Frequency of micturition: Secondary | ICD-10-CM | POA: Diagnosis not present

## 2020-12-05 DIAGNOSIS — M5442 Lumbago with sciatica, left side: Secondary | ICD-10-CM | POA: Diagnosis not present

## 2020-12-05 DIAGNOSIS — R42 Dizziness and giddiness: Secondary | ICD-10-CM | POA: Diagnosis not present

## 2020-12-05 DIAGNOSIS — M542 Cervicalgia: Secondary | ICD-10-CM | POA: Diagnosis not present

## 2020-12-16 ENCOUNTER — Ambulatory Visit
Admission: RE | Admit: 2020-12-16 | Discharge: 2020-12-16 | Disposition: A | Discharge status: Home / Self Care | Payer: Medicare PPO | Source: Ambulatory Visit | Attending: Internal Medicine | Admitting: Internal Medicine

## 2020-12-16 ENCOUNTER — Other Ambulatory Visit: Payer: Self-pay | Admitting: Internal Medicine

## 2020-12-16 ENCOUNTER — Other Ambulatory Visit: Payer: Self-pay

## 2020-12-16 DIAGNOSIS — M47812 Spondylosis without myelopathy or radiculopathy, cervical region: Secondary | ICD-10-CM | POA: Diagnosis not present

## 2020-12-16 DIAGNOSIS — M4319 Spondylolisthesis, multiple sites in spine: Secondary | ICD-10-CM | POA: Diagnosis not present

## 2020-12-16 DIAGNOSIS — M2578 Osteophyte, vertebrae: Secondary | ICD-10-CM | POA: Diagnosis not present

## 2020-12-16 DIAGNOSIS — M542 Cervicalgia: Secondary | ICD-10-CM

## 2021-01-01 DIAGNOSIS — H52203 Unspecified astigmatism, bilateral: Secondary | ICD-10-CM | POA: Diagnosis not present

## 2021-01-01 DIAGNOSIS — H5203 Hypermetropia, bilateral: Secondary | ICD-10-CM | POA: Diagnosis not present

## 2021-01-01 DIAGNOSIS — H2513 Age-related nuclear cataract, bilateral: Secondary | ICD-10-CM | POA: Diagnosis not present

## 2021-01-16 ENCOUNTER — Encounter: Payer: Self-pay | Admitting: *Deleted

## 2021-01-20 ENCOUNTER — Ambulatory Visit: Payer: PRIVATE HEALTH INSURANCE | Admitting: Psychiatry

## 2021-02-04 NOTE — Progress Notes (Signed)
[facility]  PATIENT: Kristy [name] DOB: 09/29/1955  REFERRING CLINICIAN: Andi Devon, MD HISTORY FROM: self REASON FOR VISIT: dizziness   HISTORICAL  CHIEF COMPLAINT:  Chief Complaint  Patient presents with   New Patient (Initial Visit)    Rm 1 alone here for consult on dizziness. Pt reports worsening dizziness when she is laying down. Reports she has had sx for a year or so but has worsened recently.     HISTORY OF PRESENT ILLNESS:  The patient presents for evaluation of dizziness which began around 2008. At that time she felt like the room was spinning and had no other sypmtoms. She was diagnosed with sarcoidosis around that time. Since that time she has not been able to lie flat without getting dizzy. Worse when turning her head to the left. She would sleep on an incline which would help until recently. Now she feels like her dizziness is significantly worse. She wakes up most mornings feeling light-headed and nauseated. She will occasionally have headaches with it as well. Sometimes feels like her heart is pounding out of her chest. Now can't lie flat on her back, get her hair washed, or go to the dentist due to dizziness. Takes meclizine and hydrocodone as needed which sometimes helps if she catches it early enough. Dizziness can last 2 hours at a time. It occurs nearly every day.  Notes she has a lot of sinus symptoms and wonders if this may be related.  OTHER MEDICAL CONDITIONS: Sarcoidosis (pulmonary, dx via biopsy) currently not on treatment   REVIEW OF SYSTEMS: Full 14 system review of systems performed and negative with exception of: vertigo, light-headedness, nausea, palpitations  ALLERGIES: No Known Allergies  HOME MEDICATIONS: Outpatient Medications Prior to Visit  Medication Sig Dispense Refill   Ascorbic Acid (VITA-C PO) Take by mouth.     aspirin EC 81 MG tablet Take 81 mg by mouth daily. Swallow whole.     Aspirin Effervescent  (ALKA-SELTZER ORIGINAL PO) Take 2 tablets by mouth daily as needed (allergies).     B Complex-C (SUPER B COMPLEX PO) Take by mouth.     cholecalciferol (VITAMIN D3) 25 MCG (1000 UNIT) tablet Take 1,000 Units by mouth daily.     ciprofloxacin (CIPRO) 250 MG tablet take 1 tablet po bid x 3 days 6 tablet 0   Cyanocobalamin (VITAMIN B 12 PO) Take by mouth.     Docusate Sodium (STOOL SOFTENER LAXATIVE PO) Take by mouth.     fluticasone (FLONASE) 50 MCG/ACT nasal spray Place 1 spray into both nostrils daily.     HYDROcodone-acetaminophen (NORCO/VICODIN) 5-325 MG tablet Take 1 tablet by mouth every 6 (six) hours as needed for moderate pain or severe pain. 20 tablet 0   ibuprofen (ADVIL) 600 MG tablet take 1 tablet po pc every 6 hours for 5 days then prn-post operative pain 30 tablet 1   levocetirizine (XYZAL) 5 MG tablet Take 5 mg by mouth daily as needed for allergies.     Magnesium Oxide (MAG-OX PO) Take by mouth.     meclizine (ANTIVERT) 25 MG tablet Take 25 mg by mouth 3 (three) times daily as needed for dizziness.     meloxicam (MOBIC) 15 MG tablet Take 15 mg by mouth daily.     mirabegron ER (MYRBETRIQ) 25 MG TB24 tablet Take 25 mg by mouth daily.     OVER THE COUNTER MEDICATION Apply 1 application topically daily as needed (pain). magnesport balm  oxymetazoline (AFRIN) 0.05 % nasal spray Place 1 spray into both nostrils daily as needed for congestion.     phentermine 37.5 MG capsule Take 37.5 mg by mouth every morning.     PRESCRIPTION MEDICATION Inject 1 Dose as directed every Monday. Lipotropic injection     UNABLE TO FIND Med Name: Fiber well gummy's     valACYclovir (VALTREX) 500 MG tablet Take 500 mg by mouth daily as needed (fever blisters).     No facility-administered medications prior to visit.    PAST MEDICAL HISTORY: Past Medical History:  Diagnosis Date   Dizziness and giddiness    Hyperlipidemia    Migraine    Sarcoidosis    Stress incontinence     PAST SURGICAL  HISTORY: Past Surgical History:  Procedure Laterality Date   BLADDER SUSPENSION N/A 12/07/2019   Procedure: TRANSVAGINAL TAPE (TVT) PROCEDURE;  Surgeon: Osborn Coho, MD;  Location: [facility] OR;  Service: Gynecology;  Laterality: N/A;   CYSTOSCOPY N/A 12/07/2019   Procedure: CYSTOSCOPY;  Surgeon: Osborn Coho, MD;  Location: [facility] OR;  Service: Gynecology;  Laterality: N/A;   NO PAST SURGERIES      FAMILY HISTORY: Family History  Problem Relation Age of Onset   Alzheimer's disease Mother    COPD Father    Breast cancer Maternal Aunt    Breast cancer Paternal Aunt     SOCIAL HISTORY: Social History   Socioeconomic History   Marital status: Single    Spouse name: Not on file   Number of children: 0   Years of education: Not on file   Highest education level: Master's degree (e.g., MA, MS, MEng, MEd, MSW, MBA)  Occupational History    Comment: [employer]  Tobacco Use   Smoking status: Never   Smokeless tobacco: Never  Substance and Sexual Activity   Alcohol use: No   Drug use: No   Sexual activity: Never    Birth control/protection: None  Other Topics Concern   Not on file  Social History Narrative   Right handed   Caffeine- 2 soft drinks per day   Lives at home alone   Social Determinants of Health   Financial Resource Strain: Not on file  Food Insecurity: Not on file  Transportation Needs: Not on file  Physical Activity: Not on file  Stress: Not on file  Social Connections: Not on file  Intimate Partner Violence: Not on file     PHYSICAL EXAM  GENERAL EXAM/CONSTITUTIONAL: Vitals:  Vitals:   02/05/21 0817  BP: 132/84  Pulse: 76  SpO2: 98%  Weight: 166 lb (75.3 kg)  Height: 5\' 4"  (1.626 m)   Body mass index is 28.49 kg/m. Wt Readings from Last 3 Encounters:  02/05/21 166 lb (75.3 kg)  12/07/19 189 lb 1.6 oz (85.8 kg)  12/05/19 189 lb 1.6 oz (85.8 kg)   Patient is in no distress; well developed, nourished and groomed; neck is  supple  CARDIOVASCULAR: Examination of peripheral vascular system by observation and palpation is normal  EYES: Pupils round and reactive to light, Visual fields full to confrontation, Extraocular movements intact  MUSCULOSKELETAL: Gait, strength, tone, movements noted in Neurologic exam below  NEUROLOGIC: MENTAL STATUS:  awake, alert, oriented to person, place and time recent and remote memory intact normal attention and concentration language fluent, comprehension intact fund of knowledge appropriate  CRANIAL NERVE:  2nd, 3rd, 4th, 6th - pupils equal and reactive to light, visual fields full to confrontation, extraocular muscles intact, no nystagmus 5th -  facial sensation symmetric 7th - facial strength symmetric 8th - hearing intact 9th - palate elevates symmetrically, uvula midline 11th - shoulder shrug symmetric 12th - tongue protrusion midline  MOTOR:  normal bulk and tone, full strength in the BUE, BLE  SENSORY:  normal and symmetric to light touch all extremities  COORDINATION:  finger-nose-finger, heel-shin normal bilaterally  REFLEXES:  deep tendon reflexes present and symmetric  GAIT/STATION:  normal  Mild vertigo induced by Dix-Hallpike to left, no nystagmus visualized   DIAGNOSTIC DATA (LABS, IMAGING, TESTING) - I reviewed patient records, labs, notes, testing and imaging myself where available.  Lab Results  Component Value Date   WBC 11.0 (H) 12/08/2019   HGB 11.7 (L) 12/08/2019   HCT 35.5 (L) 12/08/2019   MCV 87.4 12/08/2019   PLT 255 12/08/2019      Component Value Date/Time   NA 141 12/08/2019 0511   K 4.0 12/08/2019 0511   CL 108 12/08/2019 0511   CO2 26 12/08/2019 0511   GLUCOSE 116 (H) 12/08/2019 0511   BUN 11 12/08/2019 0511   CREATININE 0.79 12/08/2019 0511   CALCIUM 8.9 12/08/2019 0511   GFRNONAA >60 12/08/2019 0511   ASSESSMENT AND PLAN  65 y.o. year old female with a history of sarcoidosis who presents for evaluation of  worsening vertigo and light-headedness. She did have mild vertigo induced by Dix-Hallpike to the left, but no nystagmus was visualized. This could represent BPPV, but given history of sarcoidosis will order MRI brain to rule out neurologic involvement as well as possible posterior circulation stroke. Referral to vestibular rehab placed to help with vertigo. Will start PRN Zofran for nausea.   1. Vertigo       PLAN: -MRI brain with contrast -Referral to vestibular rehab -Zofran 8 mg TID PRN  Orders Placed This Encounter  Procedures   MR BRAIN W WO CONTRAST   Ambulatory referral to Physical Therapy    Meds ordered this encounter  Medications   ondansetron (ZOFRAN) 8 MG tablet    Sig: Take 1 tablet (8 mg total) by mouth every 8 (eight) hours as needed for nausea or vomiting.    Dispense:  20 tablet    Refill:  2    Return in about 6 months (around 08/06/2021).    Ocie Doyne, MD 02/05/21 8:58 AM  I spent an average of 31 minutes chart reviewing and counseling the patient, with at least 50% of the time face to face with the patient.   [facility] [address] [place], [place] [address] [phone]

## 2021-02-05 ENCOUNTER — Encounter: Payer: Self-pay | Admitting: Psychiatry

## 2021-02-05 ENCOUNTER — Ambulatory Visit (INDEPENDENT_AMBULATORY_CARE_PROVIDER_SITE_OTHER): Payer: Medicare PPO | Admitting: Psychiatry

## 2021-02-05 VITALS — BP 132/84 | HR 76 | Ht 64.0 in | Wt 166.0 lb

## 2021-02-05 DIAGNOSIS — R42 Dizziness and giddiness: Secondary | ICD-10-CM | POA: Diagnosis not present

## 2021-02-05 MED ORDER — ONDANSETRON HCL 8 MG PO TABS: 8.0000 mg | ORAL_TABLET | Freq: Three times a day (TID) | ORAL | 2 refills | Status: DC | PRN

## 2021-02-05 NOTE — Patient Instructions (Signed)
MRI brain Referral to vestibular rehab Take Zofran (ondansetron) every 8 hours as needed for nausea

## 2021-03-06 ENCOUNTER — Observation Stay (HOSPITAL_BASED_OUTPATIENT_CLINIC_OR_DEPARTMENT_OTHER): Payer: Medicare (Managed Care)

## 2021-03-06 ENCOUNTER — Observation Stay (HOSPITAL_COMMUNITY)
Admission: EM | Admit: 2021-03-06 | Discharge: 2021-03-07 | Disposition: A | Discharge status: Home / Self Care | Payer: Medicare (Managed Care) | Attending: Family Medicine | Admitting: Family Medicine

## 2021-03-06 ENCOUNTER — Ambulatory Visit
Admission: RE | Admit: 2021-03-06 | Discharge: 2021-03-06 | Disposition: A | Discharge status: Home / Self Care | Payer: Medicare (Managed Care) | Source: Ambulatory Visit | Attending: Psychiatry | Admitting: Psychiatry

## 2021-03-06 ENCOUNTER — Observation Stay (HOSPITAL_COMMUNITY): Payer: Medicare (Managed Care)

## 2021-03-06 ENCOUNTER — Encounter (HOSPITAL_COMMUNITY): Payer: Self-pay | Admitting: Emergency Medicine

## 2021-03-06 ENCOUNTER — Other Ambulatory Visit: Payer: Self-pay

## 2021-03-06 ENCOUNTER — Telehealth: Payer: Self-pay | Admitting: Psychiatry

## 2021-03-06 DIAGNOSIS — Z7982 Long term (current) use of aspirin: Secondary | ICD-10-CM | POA: Diagnosis not present

## 2021-03-06 DIAGNOSIS — I639 Cerebral infarction, unspecified: Secondary | ICD-10-CM | POA: Diagnosis not present

## 2021-03-06 DIAGNOSIS — I351 Nonrheumatic aortic (valve) insufficiency: Secondary | ICD-10-CM | POA: Diagnosis not present

## 2021-03-06 DIAGNOSIS — I63512 Cerebral infarction due to unspecified occlusion or stenosis of left middle cerebral artery: Secondary | ICD-10-CM | POA: Diagnosis not present

## 2021-03-06 DIAGNOSIS — Z20822 Contact with and (suspected) exposure to covid-19: Secondary | ICD-10-CM | POA: Diagnosis not present

## 2021-03-06 DIAGNOSIS — M6281 Muscle weakness (generalized): Secondary | ICD-10-CM | POA: Insufficient documentation

## 2021-03-06 DIAGNOSIS — R42 Dizziness and giddiness: Secondary | ICD-10-CM

## 2021-03-06 DIAGNOSIS — G43909 Migraine, unspecified, not intractable, without status migrainosus: Secondary | ICD-10-CM | POA: Insufficient documentation

## 2021-03-06 DIAGNOSIS — E785 Hyperlipidemia, unspecified: Secondary | ICD-10-CM | POA: Diagnosis not present

## 2021-03-06 DIAGNOSIS — I6389 Other cerebral infarction: Secondary | ICD-10-CM

## 2021-03-06 HISTORY — DX: Dizziness and giddiness: R42

## 2021-03-06 LAB — HEPATIC FUNCTION PANEL
ALT: 16 U/L (ref 0–44)
AST: 18 U/L (ref 15–41)
Albumin: 3.6 g/dL (ref 3.5–5.0)
Alkaline Phosphatase: 85 U/L (ref 38–126)
Bilirubin, Direct: <0.1 mg/dL (ref 0.0–0.2)
Total Bilirubin: 0.5 mg/dL (ref 0.3–1.2)
Total Protein: 6.3 g/dL — ABNORMAL LOW (ref 6.5–8.1)

## 2021-03-06 LAB — URINALYSIS, ROUTINE W REFLEX MICROSCOPIC
Bilirubin Urine: NEGATIVE
Glucose, UA: NEGATIVE mg/dL
Hgb urine dipstick: NEGATIVE
Ketones, ur: NEGATIVE mg/dL
Leukocytes,Ua: NEGATIVE
Nitrite: NEGATIVE
Protein, ur: NEGATIVE mg/dL
Specific Gravity, Urine: 1.01 (ref 1.005–1.030)
pH: 7 (ref 5.0–8.0)

## 2021-03-06 LAB — CBC WITH DIFFERENTIAL/PLATELET
Abs Immature Granulocytes: 0.01 10*3/uL (ref 0.00–0.07)
Basophils Absolute: 0.1 10*3/uL (ref 0.0–0.1)
Basophils Relative: 2 %
Eosinophils Absolute: 0.1 10*3/uL (ref 0.0–0.5)
Eosinophils Relative: 3 %
HCT: 40.4 % (ref 36.0–46.0)
Hemoglobin: 13.8 g/dL (ref 12.0–15.0)
Immature Granulocytes: 0 %
Lymphocytes Relative: 35 %
Lymphs Abs: 1.2 10*3/uL (ref 0.7–4.0)
MCH: 29.6 pg (ref 26.0–34.0)
MCHC: 34.2 g/dL (ref 30.0–36.0)
MCV: 86.5 fL (ref 80.0–100.0)
Monocytes Absolute: 0.3 10*3/uL (ref 0.1–1.0)
Monocytes Relative: 10 %
Neutro Abs: 1.7 10*3/uL (ref 1.7–7.7)
Neutrophils Relative %: 50 %
Platelets: 303 10*3/uL (ref 150–400)
RBC: 4.67 MIL/uL (ref 3.87–5.11)
RDW: 11.6 % (ref 11.5–15.5)
WBC: 3.3 10*3/uL — ABNORMAL LOW (ref 4.0–10.5)
nRBC: 0 % (ref 0.0–0.2)

## 2021-03-06 LAB — RESP PANEL BY RT-PCR (FLU A&B, COVID) ARPGX2
Influenza A by PCR: NEGATIVE
Influenza B by PCR: NEGATIVE
SARS Coronavirus 2 by RT PCR: NEGATIVE

## 2021-03-06 LAB — PROTIME-INR
INR: 1.1 (ref 0.8–1.2)
Prothrombin Time: 13.8 seconds (ref 11.4–15.2)

## 2021-03-06 LAB — BASIC METABOLIC PANEL
Anion gap: 7 (ref 5–15)
BUN: 15 mg/dL (ref 8–23)
CO2: 27 mmol/L (ref 22–32)
Calcium: 9.2 mg/dL (ref 8.9–10.3)
Chloride: 107 mmol/L (ref 98–111)
Creatinine, Ser: 0.76 mg/dL (ref 0.44–1.00)
GFR, Estimated: >60 mL/min (ref >60)
Glucose, Bld: 99 mg/dL (ref 70–99)
Potassium: 4 mmol/L (ref 3.5–5.1)
Sodium: 141 mmol/L (ref 135–145)

## 2021-03-06 LAB — APTT: aPTT: 30 seconds (ref 24–36)

## 2021-03-06 LAB — CBC
HCT: 41.4 % (ref 36.0–46.0)
Hemoglobin: 14.1 g/dL (ref 12.0–15.0)
MCH: 29.2 pg (ref 26.0–34.0)
MCHC: 34.1 g/dL (ref 30.0–36.0)
MCV: 85.7 fL (ref 80.0–100.0)
Platelets: 303 10*3/uL (ref 150–400)
RBC: 4.83 MIL/uL (ref 3.87–5.11)
RDW: 11.4 % — ABNORMAL LOW (ref 11.5–15.5)
WBC: 3.4 10*3/uL — ABNORMAL LOW (ref 4.0–10.5)
nRBC: 0 % (ref 0.0–0.2)

## 2021-03-06 LAB — ECHOCARDIOGRAM COMPLETE
Area-P 1/2: 6.54 cm2
P 1/2 time: 559 msec
S' Lateral: 3.1 cm

## 2021-03-06 MED ORDER — STROKE: EARLY STAGES OF RECOVERY BOOK: Freq: Once | Status: DC

## 2021-03-06 MED ORDER — IOHEXOL 350 MG/ML SOLN
75.0000 mL | Freq: Once | INTRAVENOUS | Status: AC | PRN
  Administered 2021-03-06: 75 mL via INTRAVENOUS

## 2021-03-06 MED ORDER — ACETAMINOPHEN 650 MG RE SUPP: 650.0000 mg | RECTAL | Status: DC | PRN

## 2021-03-06 MED ORDER — GADOBENATE DIMEGLUMINE 529 MG/ML IV SOLN
15.0000 mL | Freq: Once | INTRAVENOUS | Status: AC | PRN
  Administered 2021-03-06: 15 mL via INTRAVENOUS

## 2021-03-06 MED ORDER — ACETAMINOPHEN 160 MG/5ML PO SOLN: 650.0000 mg | ORAL | Status: DC | PRN

## 2021-03-06 MED ORDER — ACETAMINOPHEN 325 MG PO TABS: 650.0000 mg | ORAL_TABLET | ORAL | Status: DC | PRN

## 2021-03-06 MED ORDER — ASPIRIN EC 81 MG PO TBEC
81.0000 mg | DELAYED_RELEASE_TABLET | Freq: Every day | ORAL | Status: DC
  Administered 2021-03-07: 81 mg via ORAL
  Filled 2021-03-06: qty 1

## 2021-03-06 NOTE — H&P (Signed)
History and Physical    Kristy [name] [id number] DOB: 12-03-1955 DOA: 03/06/2021  PCP: Willey Blade, MD  Patient coming from: Home  Chief Complaint: Dizziness  HPI: Kristy [name] is a 66 y.o. female with medical history significant of vertigo for years was sent to a neurologist as an outpatient secondary to markedly worsening of her vertigo and dizziness symptoms for over a week by her primary care physician.  Neurologist ordered MRI which did in fact show an acute CVA.  Patient reports that over the last couple days her dizziness is actually gotten better.  She denies any other focal neurological deficits.  No numbness tingling or weakness anywhere.  She denies any fevers.  She denies any nausea vomiting diarrhea she denies any cough or shortness of breath.  Patient be referred for admission for further stroke work-up.   Review of Systems: As per HPI otherwise 10 point review of systems negative.   Past Medical History:  Diagnosis Date   Dizziness    Dizziness and giddiness    Hyperlipidemia    Migraine    Sarcoidosis    Stress incontinence     Past Surgical History:  Procedure Laterality Date   BLADDER SUSPENSION N/A 12/07/2019   Procedure: TRANSVAGINAL TAPE (TVT) PROCEDURE;  Surgeon: Everett Graff, MD;  Location: [place];  Service: Gynecology;  Laterality: N/A;   CYSTOSCOPY N/A 12/07/2019   Procedure: CYSTOSCOPY;  Surgeon: Everett Graff, MD;  Location: [place];  Service: Gynecology;  Laterality: N/A;   NO PAST SURGERIES       reports that she has never smoked. She has never used smokeless tobacco. She reports that she does not drink alcohol and does not use drugs.  No Known Allergies  Family History  Problem Relation Age of Onset   Alzheimer's disease Mother    COPD Father    Breast cancer Maternal Aunt    Breast cancer Paternal Aunt     Prior to Admission medications   Medication Sig Start Date End Date Taking? Authorizing Provider  Ascorbic Acid (VITA-C  PO) Take 1,000 mg by mouth daily.   Yes [provider]  aspirin EC 81 MG tablet Take 81 mg by mouth daily. Swallow whole.   Yes [provider]  Aspirin Effervescent (ALKA-SELTZER ORIGINAL PO) Take 2 tablets by mouth daily as needed (allergies).   Yes [provider]  B Complex-C (SUPER B COMPLEX PO) Take by mouth.   Yes [provider]  cetirizine (ZYRTEC) 10 MG tablet Take 10 mg by mouth daily.   Yes [provider]  cholecalciferol (VITAMIN D3) 25 MCG (1000 UNIT) tablet Take 1,000 Units by mouth daily.   Yes [provider]  Cyanocobalamin (VITAMIN B 12 PO) Take 5,000 mcg by mouth daily.   Yes [provider]  Docusate Sodium (STOOL SOFTENER LAXATIVE PO) Take 200 mg by mouth daily.   Yes [provider]  fluticasone (FLONASE) 50 MCG/ACT nasal spray Place 1 spray into both nostrils daily.   Yes [provider]  HYDROcodone-acetaminophen (NORCO/VICODIN) 5-325 MG tablet Take 1 tablet by mouth every 6 (six) hours as needed for moderate pain or severe pain. 12/08/19  Yes Everett Graff, MD  ibuprofen (ADVIL) 600 MG tablet take 1 tablet po pc every 6 hours for 5 days then prn-post operative pain 12/08/19  Yes Earnstine Regal, PA-C  Magnesium Oxide (MAG-OX PO) Take 400 mg by mouth 2 (two) times daily.   Yes [provider]  meclizine (ANTIVERT) 25  MG tablet Take 25 mg by mouth 3 (three) times daily as needed for dizziness.   Yes [provider]  meloxicam (MOBIC) 15 MG tablet Take 15 mg by mouth daily.   Yes [provider]  mirabegron ER (MYRBETRIQ) 25 MG TB24 tablet Take 25 mg by mouth daily.   Yes [provider]  Omega-3 Fatty Acids (FISH OIL) 1000 MG CAPS Take 1,000 mg by mouth daily.   Yes [provider]  ondansetron (ZOFRAN) 8 MG tablet Take 1 tablet (8 mg total) by mouth every 8 (eight) hours as needed for nausea or vomiting. 02/05/21  Yes Genia Harold, MD  OVER THE  COUNTER MEDICATION Apply 1 application topically daily as needed (pain). magnesport balm   Yes [provider]  oxymetazoline (AFRIN) 0.05 % nasal spray Place 1 spray into both nostrils daily as needed for congestion.   Yes [provider]  phentermine 37.5 MG capsule Take 37.5 mg by mouth every morning.   Yes [provider]  PRESCRIPTION MEDICATION Inject 1 Dose as directed every Monday. Lipotropic injection   Yes [provider]  UNABLE TO FIND Take 1 tablet by mouth daily. Med Name: Fiber well gummy's   Yes [provider]  valACYclovir (VALTREX) 500 MG tablet Take 500 mg by mouth daily as needed (fever blisters).   Yes [provider]    Physical Exam: Vitals:   03/06/21 0904 03/06/21 1119  BP: (!) 150/79 124/80  Pulse: 71 72  Resp: 18 14  Temp: 98.3 F (36.8 C) 98.9 F (37.2 C)  TempSrc: Oral Oral  SpO2: 100% 98%      Constitutional: NAD, calm, comfortable Vitals:   03/06/21 0904 03/06/21 1119  BP: (!) 150/79 124/80  Pulse: 71 72  Resp: 18 14  Temp: 98.3 F (36.8 C) 98.9 F (37.2 C)  TempSrc: Oral Oral  SpO2: 100% 98%   Eyes: PERRL, lids and conjunctivae normal ENMT: Mucous membranes are moist. Posterior pharynx clear of any exudate or lesions.Normal dentition.  Neck: normal, supple, no masses, no thyromegaly Respiratory: clear to auscultation bilaterally, no wheezing, no crackles. Normal respiratory effort. No accessory muscle use.  Cardiovascular: Regular rate and rhythm, no murmurs / rubs / gallops. No extremity edema. 2+ pedal pulses. No carotid bruits.  Abdomen: no tenderness, no masses palpated. No hepatosplenomegaly. Bowel sounds positive.  Musculoskeletal: no clubbing / cyanosis. No joint deformity upper and lower extremities. Good ROM, no contractures. Normal muscle tone.  Skin: no rashes, lesions, ulcers. No induration Neurologic: CN 2-12 grossly intact. Sensation intact, DTR normal. Strength 5/5 in all  4.  Psychiatric: Normal judgment and insight. Alert and oriented x 3. Normal mood.    Labs on Admission: I have personally reviewed following labs and imaging studies  CBC: Recent Labs  Lab 03/06/21 0920  WBC 3.4*  HGB 14.1  HCT 41.4  MCV 85.7  PLT [id number]   Basic Metabolic Panel: Recent Labs  Lab 03/06/21 0920  NA 141  K 4.0  CL 107  CO2 27  GLUCOSE 99  BUN 15  CREATININE 0.76  CALCIUM 9.2   GFR: CrCl cannot be calculated (Unknown ideal weight.). Liver Function Tests: No results for input(s): AST, ALT, ALKPHOS, BILITOT, PROT, ALBUMIN in the last 168 hours. No results for input(s): LIPASE, AMYLASE in the last 168 hours. No results for input(s): AMMONIA in the last 168 hours. Coagulation Profile: Recent Labs  Lab 03/06/21 1120  INR 1.1   Cardiac Enzymes: No results for  input(s): CKTOTAL, CKMB, CKMBINDEX, TROPONINI in the last 168 hours. BNP (last 3 results) No results for input(s): PROBNP in the last 8760 hours. HbA1C: No results for input(s): HGBA1C in the last 72 hours. CBG: No results for input(s): GLUCAP in the last 168 hours. Lipid Profile: No results for input(s): CHOL, HDL, LDLCALC, TRIG, CHOLHDL, LDLDIRECT in the last 72 hours. Thyroid Function Tests: No results for input(s): TSH, T4TOTAL, FREET4, T3FREE, THYROIDAB in the last 72 hours. Anemia Panel: No results for input(s): VITAMINB12, FOLATE, FERRITIN, TIBC, IRON, RETICCTPCT in the last 72 hours. Urine analysis:    Component Value Date/Time   COLORURINE YELLOW 03/06/2021 0914   APPEARANCEUR CLEAR 03/06/2021 0914   LABSPEC 1.010 03/06/2021 0914   PHURINE 7.0 03/06/2021 0914   GLUCOSEU NEGATIVE 03/06/2021 0914   HGBUR NEGATIVE 03/06/2021 0914   BILIRUBINUR NEGATIVE 03/06/2021 0914   KETONESUR NEGATIVE 03/06/2021 0914   PROTEINUR NEGATIVE 03/06/2021 0914   NITRITE NEGATIVE 03/06/2021 0914   LEUKOCYTESUR NEGATIVE 03/06/2021 0914   Sepsis Labs:  !!!!!!!!!!!!!!!!!!!!!!!!!!!!!!!!!!!!!!!!!!!! @LABRCNTIP (procalcitonin:4,lacticidven:4) )No results found for this or any previous visit (from the past 240 hour(s)).   Radiological Exams on Admission: MR BRAIN W WO CONTRAST  Result Date: 03/06/2021 CLINICAL DATA:  66 year old female with dizziness, vertigo. Outpatient exam, patient referred to emergency department for stroke workup, and I was contacted by neural hospitalist regarding the findings on this exam at 1031 hours on 03/06/2021. EXAM: MRI HEAD WITHOUT AND WITH CONTRAST TECHNIQUE: Multiplanar, multiecho pulse sequences of the brain and surrounding structures were obtained without and with intravenous contrast. CONTRAST:  28mL MULTIHANCE GADOBENATE DIMEGLUMINE 529 MG/ML IV SOLN COMPARISON:  Head CT 03/10/2016. FINDINGS: Brain: Patchy, roughly 15 mm area of restricted diffusion in the left superior occipital lobe just below the parieto-occipital sulcus (series 9, image 37 and series 5, image 19) affecting cortex and subcortical white matter. Associated T2 and FLAIR hyperintensity. No T2* or SWI imaging, but no evidence of hemorrhage. No enhancement following contrast. No other restricted diffusion. No midline shift, mass effect, evidence of mass lesion, ventriculomegaly, extra-axial collection. Cervicomedullary junction and pituitary are within normal limits. Widely scattered but generally small cerebral white matter T2 and FLAIR hyperintensity in both hemispheres, nonspecific pattern. No cortical encephalomalacia identified. Deep gray matter nuclei within normal limits. Mild patchy T2 hyperintensity in the pons greater on the right. Cerebellum within normal limits. No abnormal enhancement identified.  No dural thickening identified. Vascular: Major intracranial vascular flow voids are preserved. The major dural venous sinuses seem to be enhancing (no precontrast axial T1) and patent. Skull and upper cervical spine: Cervical spine disc and endplate  degeneration with no visible spinal stenosis. Visualized bone marrow signal is within normal limits. Sinuses/Orbits: Negative orbits. Paranasal Visualized paranasal sinuses and mastoids are clear. Other: Visible internal auditory structures appear normal. Normal stylomastoid foramina. Negative visible scalp and face. IMPRESSION: 1. Small patchy acute cortical infarct in the left superior occipital lobe. No associated hemorrhage or mass effect. 2. No other acute intracranial abnormality. Moderate for age nonspecific signal changes in the cerebral white matter and pons, most commonly due to chronic small vessel disease. 3. Study discussed by telephone with Dr. Lorrin Goodell on 03/06/2021 at 1032 hours. Electronically Signed   By: Genevie Ann M.D.   On: 03/06/2021 10:40    EKG: Independently reviewed.  Normal sinus rhythm right bundle branch block old  Assessment/Plan  66 year old female with severe dizziness had outpatient MRI which showed small patchy acute cortical infarct in the left superior occipital lobe  Principal Problem:    CVA (cerebral vascular accident) (HCC)-full CVA work-up.  Obtain cardiac echo.  Obtain carotid Dopplers.  No focal neurological deficits at this time.  Dizziness is actually gotten better over the last couple days.  Placed on aspirin.  Check fasting lipid panel.  Check hemoglobin A1c.  Frequent neurological checks.  Monitor on telemetry monitoring.  Neurology service aware of patient being here in need for consultation.  Active Problems:   Dizziness-noted, as above    Further recommendations pending overall hospital course   DVT prophylaxis: SCDs Code Status: Full Family Communication: None Disposition Plan: 1 to 2 days Consults called: Neurology called and aware patient is here Admission status: Observation   Crystalle Popwell A MD [facility]  If 7PM-7AM, please contact night-coverage [email or web address] Password [facility]  03/06/2021, 11:53 AM

## 2021-03-06 NOTE — Progress Notes (Signed)
Pt at GI for MRI brain. Abnormal signal on images, MRI images were reviewd by pt provider Dr. Ocie Doyne. She suggested to send pt to ED for stroke work up and evaluation by neuro team.  @ 850 pt advised to go to [facility] per Dr. Delena Bali suggestion. Pt has a driver with her, pt is given instruction to the [place] ED. Pt acknowledge and agrees.

## 2021-03-06 NOTE — Telephone Encounter (Signed)
[facility] would like a call back to discuss deficiencies seen on MRI. Pt will be at GI for 15 mins. Contact info:  [phone]

## 2021-03-06 NOTE — Evaluation (Signed)
Speech Language Pathology Evaluation Patient Details Name: Kristy [name] MRN: [id number] DOB: 24-Oct-1955 Today's Date: 03/06/2021 Time: 1320-1340 SLP Time Calculation (min) (ACUTE ONLY): 20 min  Problem List:  Patient Active Problem List   Diagnosis Date Noted   CVA (cerebral vascular accident) ([place]) 03/06/2021   Dizziness 03/06/2021   Female stress incontinence 11/23/2019   Atrophic vaginitis 09/01/2011   Herpesvirus 2 09/01/2011   Vulvar lesion 08/11/2011   SARCOIDOSIS, PULMONARY 12/03/2006   ALLERGIC RHINITIS 12/03/2006   Past Medical History:  Past Medical History:  Diagnosis Date   Dizziness    Dizziness and giddiness    Hyperlipidemia    Migraine    Sarcoidosis    Stress incontinence    Past Surgical History:  Past Surgical History:  Procedure Laterality Date   BLADDER SUSPENSION N/A 12/07/2019   Procedure: TRANSVAGINAL TAPE (TVT) PROCEDURE;  Surgeon: Everett Graff, MD;  Location: [place];  Service: Gynecology;  Laterality: N/A;   CYSTOSCOPY N/A 12/07/2019   Procedure: CYSTOSCOPY;  Surgeon: Everett Graff, MD;  Location: [place];  Service: Gynecology;  Laterality: N/A;   NO PAST SURGERIES     HPI:  66yo female admitted 03/06/21 with dizziness, vertigo. PMH: sarcoidosis, chronic dizziness. MRI = Small patchy acute cortical infarct in the left superior occipital lobe   Assessment / Plan / Recommendation Clinical Impression  Pt seen for evaluation of speech, language, and cognition. CN exam is unremarkable. Speech is fully intelligible without evidence of dysarthria. Receptive and Expressive Language are intact. The [place] Mental Status (SLUMS) Examination was administered. Pt scored 27/30 indicating performance within normal limits. Pt appears to be at baseline level of function, and is not in need of further ST intervention. Please reconsult if needs arise.    SLP Assessment  SLP Recommendation/Assessment: Patient does not need any further Speech Language  Pathology Services    Recommendations for follow up therapy are one component of a multi-disciplinary discharge planning process, led by the attending physician.  Recommendations may be updated based on patient status, additional functional criteria and insurance authorization.    Follow Up Recommendations  No SLP follow up    Assistance Recommended at Discharge  None  Functional Status Assessment Patient has not had a recent decline in their functional status     SLP Evaluation Cognition  Overall Cognitive Status: Within Functional Limits for tasks assessed       Comprehension  Auditory Comprehension Overall Auditory Comprehension: Appears within functional limits for tasks assessed    Expression Expression Primary Mode of Expression: Verbal Verbal Expression Overall Verbal Expression: Appears within functional limits for tasks assessed   Oral / Motor  Oral Motor/Sensory Function Overall Oral Motor/Sensory Function: Within functional limits Motor Speech Overall Motor Speech: Appears within functional limits for tasks assessed           Pricila Bridge B. Quentin Ore, [facility], [place] Speech Language Pathologist Office: [phone]  Shonna Chock 03/06/2021, 1:41 PM

## 2021-03-06 NOTE — ED Provider Notes (Signed)
[facility] EMERGENCY DEPARTMENT Provider Note   CSN: [id number] Arrival date & time: 03/06/21  0900     History  Chief Complaint  Patient presents with   Dizziness    Kristy [name] is a 66 y.o. female.  Patient with history of sarcoidosis not currently on treatment, chronic dizziness described as vertigo --presents to the emergency department after having an abnormal MRI this morning.  Patient has had dizziness, greater than 10 years, that is a spinning sensation with associated nausea when she lies flat or lies on her left side.  After discussion with her primary care provider, given that her symptoms have become worse over the past several months, she was referred to [facility] neurology.  She had an appointment there in December 2022 and was referred for MRI which was performed today.  Patient symptoms have been worsening in the sense that she will wake up more often in the mornings feeling dizzy and nauseous. Patient denies other signs of stroke including: facial droop, slurred speech, aphasia, weakness/numbness in extremities, imbalance/trouble walking.  This morning she is at her baseline.  She states that when she initially lied down for the MRI, it brought on the dizziness, however her head was cushioned and she was able to tolerate it well.  She denies other symptoms.  States that she would like to be able to attend a family function tonight.       Home Medications Prior to Admission medications   Medication Sig Start Date End Date Taking? Authorizing Provider  Ascorbic Acid (VITA-C PO) Take by mouth.    [provider]  aspirin EC 81 MG tablet Take 81 mg by mouth daily. Swallow whole.    [provider]  Aspirin Effervescent (ALKA-SELTZER ORIGINAL PO) Take 2 tablets by mouth daily as needed (allergies).    [provider]  B Complex-C (SUPER B COMPLEX PO) Take by mouth.    [provider]  cholecalciferol (VITAMIN D3) 25 MCG  (1000 UNIT) tablet Take 1,000 Units by mouth daily.    [provider]  ciprofloxacin (CIPRO) 250 MG tablet take 1 tablet po bid x 3 days 12/08/19   [name], PA-C  Cyanocobalamin (VITAMIN B 12 PO) Take by mouth.    [provider]  Docusate Sodium (STOOL SOFTENER LAXATIVE PO) Take by mouth.    [provider]  fluticasone (FLONASE) 50 MCG/ACT nasal spray Place 1 spray into both nostrils daily.    [provider]  HYDROcodone-acetaminophen (NORCO/VICODIN) 5-325 MG tablet Take 1 tablet by mouth every 6 (six) hours as needed for moderate pain or severe pain. 12/08/19   Osborn Coho, MD  ibuprofen (ADVIL) 600 MG tablet take 1 tablet po pc every 6 hours for 5 days then prn-post operative pain 12/08/19   [name], PA-C  levocetirizine (XYZAL) 5 MG tablet Take 5 mg by mouth daily as needed for allergies.    [provider]  Magnesium Oxide (MAG-OX PO) Take by mouth.    [provider]  meclizine (ANTIVERT) 25 MG tablet Take 25 mg by mouth 3 (three) times daily as needed for dizziness.    [provider]  meloxicam (MOBIC) 15 MG tablet Take 15 mg by mouth daily.    [provider]  mirabegron ER (MYRBETRIQ) 25 MG TB24 tablet Take 25 mg by mouth daily.    [provider]  ondansetron (ZOFRAN) 8 MG tablet Take 1 tablet (8 mg total) by mouth every 8 (eight)  hours as needed for nausea or vomiting. 02/05/21   Ocie Doynehima, Jennifer, MD  OVER THE COUNTER MEDICATION Apply 1 application topically daily as needed (pain). magnesport balm    [provider]  oxymetazoline (AFRIN) 0.05 % nasal spray Place 1 spray into both nostrils daily as needed for congestion.    [provider]  phentermine 37.5 MG capsule Take 37.5 mg by mouth every morning.    [provider]  PRESCRIPTION MEDICATION Inject 1 Dose as directed every Monday. Lipotropic injection    [provider]  UNABLE TO FIND Med Name:  Fiber well gummy's    [provider]  valACYclovir (VALTREX) 500 MG tablet Take 500 mg by mouth daily as needed (fever blisters).    [provider]      Allergies    Patient has no known allergies.    Review of Systems   Review of Systems  Physical Exam Updated Vital Signs BP (!) 150/79 (BP Location: Left Arm)    Pulse 71    Temp 98.3 F (36.8 C) (Oral)    Resp 18    SpO2 100%  Physical Exam Vitals and nursing note reviewed.  Constitutional:      Appearance: She is well-developed.  HENT:     Head: Normocephalic and atraumatic.     Right Ear: Tympanic membrane, ear canal and external ear normal.     Left Ear: Tympanic membrane, ear canal and external ear normal.     Nose: Nose normal.     Mouth/Throat:     Pharynx: Uvula midline.  Eyes:     General: Lids are normal.     Extraocular Movements:     Right eye: No nystagmus.     Left eye: No nystagmus.     Conjunctiva/sclera: Conjunctivae normal.     Pupils: Pupils are equal, round, and reactive to light.     Comments: No obvious nystagmus.  Cardiovascular:     Rate and Rhythm: Normal rate and regular rhythm.  Pulmonary:     Effort: Pulmonary effort is normal.     Breath sounds: Normal breath sounds.  Abdominal:     Palpations: Abdomen is soft.     Tenderness: There is no abdominal tenderness.  Musculoskeletal:     Cervical back: Normal range of motion and neck supple. No tenderness or bony tenderness.  Skin:    General: Skin is warm and dry.  Neurological:     Mental Status: She is alert and oriented to person, place, and time.     GCS: GCS eye subscore is 4. GCS verbal subscore is 5. GCS motor subscore is 6.     Cranial Nerves: No cranial nerve deficit.     Sensory: No sensory deficit.     Motor: No weakness.     Coordination: Coordination normal.     Gait: Gait normal.     Comments: Patient up in room, standing, sitting and positioning herself on the bed without any difficulties.  Upper  extremity myotomes tested bilaterally:  C5 Shoulder abduction 5/5 C6 Elbow flexion/wrist extension 5/5 C7 Elbow extension 5/5 C8 Finger flexion 5/5 T1 Finger abduction 5/5  Lower extremity myotomes tested bilaterally: L2 Hip flexion 5/5 L3 Knee extension 5/5 L4 Ankle dorsiflexion 5/5 S1 Ankle plantar flexion 5/5     ED Results / Procedures / Treatments   Labs (all labs ordered are listed, but only abnormal results are displayed) Labs Reviewed  CBC - Abnormal; Notable for the following components:  Result Value   WBC 3.4 (*)    RDW 11.4 (*)    All other components within normal limits  RESP PANEL BY RT-PCR (FLU A&B, COVID) ARPGX2  BASIC METABOLIC PANEL  URINALYSIS, ROUTINE W REFLEX MICROSCOPIC  PROTIME-INR  APTT  DIFFERENTIAL  HEPATIC FUNCTION PANEL  CBG MONITORING, ED    ED ECG REPORT   Date: 03/06/2021  Rate: 70  Rhythm: normal sinus rhythm  QRS Axis: right  Intervals: normal  ST/T Wave abnormalities: nonspecific T wave changes  Conduction Disutrbances:none  Narrative Interpretation:   Old EKG Reviewed: none available  I have personally reviewed the EKG tracing and agree with the computerized printout as noted.   Radiology No results found.  Procedures Procedures    Medications Ordered in ED Medications - No data to display  ED Course/ Medical Decision Making/ A&P    Patient seen and examined. History obtained directly from patient as well as external neurology notes.  Labs/EKG: CBC, BMP ordered per RN triage protocol, pending.  EKG independently reviewed and interpreted as above.  Imaging: Independently reviewed. This included: MRI of the brain performed earlier today  Medications/Fluids: None needed.  Most recent vital signs reviewed and are as follows: BP (!) 150/79 (BP Location: Left Arm)    Pulse 71    Temp 98.3 F (36.8 C) (Oral)    Resp 18    SpO2 100%   Initial impression: We will discuss with neuro hospitalist to determine how to  proceed.  10:52 AM Reassessment performed. Patient appears stable.   I consulted with Dr. Derry Lory of neuro hospitalist by telephone.  He has personally reviewed MRI studies and spoken with neuroradiologist.  Patient has an acute occipital stroke and will need admission to the hospital.  Labs and imaging to this point personally reviewed and interpreted including: CBC with slightly low white blood cell count, otherwise negative.  BMP and urine which were negative.  Reviewed pertinent lab work and imaging with patient at bedside including: MRI positive for stroke.  Most current vital signs reviewed and are as follows: BP (!) 150/79 (BP Location: Left Arm)    Pulse 71    Temp 98.3 F (36.8 C) (Oral)    Resp 18    SpO2 100%   Plan: Admit to hospital, hospitalist paged.  MRI shows: IMPRESSION: 1. Small patchy acute cortical infarct (97mm) in the left superior occipital lobe. No associated hemorrhage or mass effect.  11:01 AM Spoke with Dr. Onalee Hua of [facility] who will see patient.                           Medical Decision Making Amount and/or Complexity of Data Reviewed Labs: ordered.   Patient with ongoing dizziness and vertigo.  Symptoms today are the patient's baseline without any acute changes or new symptoms.  She was incidentally found to have an acute infarct on MRI ordered as an outpatient this morning and was sent to the emergency department for stroke work-up.          Final Clinical Impression(s) / ED Diagnoses Final diagnoses:  Cerebrovascular accident (CVA), unspecified mechanism (HCC)  Chronic vertigo    Rx / DC Orders ED Discharge Orders     None         Renne Crigler, PA-C 03/06/21 1102    Tanda Rockers A, DO 03/06/21 1611
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
## 2021-03-06 NOTE — ED Triage Notes (Addendum)
Pt. Stated, I came from having a brain scan for having dizziness  for the last 2 years and has gotten worse in the last 3 months and sent me here for ? Had a stroke earlier

## 2021-03-06 NOTE — ED Notes (Signed)
Pt transported to Echo via stretcher. Per RN on 3W, pt may come to ready bed 3W07 when Echo is completed.

## 2021-03-06 NOTE — Telephone Encounter (Signed)
Returned call to [employer] and tech stated he has already talked to Dr Delena Bali.

## 2021-03-06 NOTE — Progress Notes (Signed)
°  Echocardiogram 2D Echocardiogram has been performed.  Delcie Roch 03/06/2021, 3:55 PM

## 2021-03-06 NOTE — Progress Notes (Signed)
Carotid duplex bilateral study completed.   Please see CV Proc for preliminary results.   Victorhugo Preis, RDMS, RVT  

## 2021-03-06 NOTE — TOC Initial Note (Signed)
Transition of Care [facility]) - Initial/Assessment Note    Patient Details  Name: Kristy [name] MRN: [id number] Date of Birth: [birth year]  Transition of Care [facility]) CM/SW Contact:    Lockie Pares, RN Phone Number: 03/06/2021, 3:32 PM  Clinical Narrative:                  The Transition of Care Department [facility]) has reviewed patient and no TOC needs have been identified at this time. We will continue to monitor patient advancement through interdisciplinary progression rounds. If new patient transition needs arise, please place a TOC consult       Patient Goals and CMS Choice        Expected Discharge Plan and Services                                                Prior Living Arrangements/Services                       Activities of Daily Living      Permission Sought/Granted                  Emotional Assessment              Admission diagnosis:  CVA (cerebral vascular accident) [facility]) [I63.9] Patient Active Problem List   Diagnosis Date Noted   CVA (cerebral vascular accident) (HCC) 03/06/2021   Dizziness 03/06/2021   Female stress incontinence 11/23/2019   Atrophic vaginitis 09/01/2011   Herpesvirus 2 09/01/2011   Vulvar lesion 08/11/2011   SARCOIDOSIS, PULMONARY 12/03/2006   ALLERGIC RHINITIS 12/03/2006   PCP:  Andi Devon, MD Pharmacy:   [facility] [phone] Ginette Otto, [place] - [address] AT [facility] [address] [place] [address] Phone: [phone] Fax: [phone]     Social Determinants of Health (SDOH) Interventions    Readmission Risk Interventions No flowsheet data found.

## 2021-03-06 NOTE — Plan of Care (Signed)
Patient received from ED, A&Ox4, VSS, no acute distress noticed, will continue to monitor. 

## 2021-03-07 ENCOUNTER — Encounter (HOSPITAL_COMMUNITY)
Admission: EM | Disposition: A | Discharge status: Home / Self Care | Payer: Self-pay | Source: Home / Self Care | Attending: Emergency Medicine

## 2021-03-07 DIAGNOSIS — I639 Cerebral infarction, unspecified: Secondary | ICD-10-CM

## 2021-03-07 DIAGNOSIS — R42 Dizziness and giddiness: Secondary | ICD-10-CM

## 2021-03-07 HISTORY — PX: LOOP RECORDER INSERTION: EP1214

## 2021-03-07 LAB — LIPID PANEL
Cholesterol: 244 mg/dL — ABNORMAL HIGH (ref 0–200)
HDL: 93 mg/dL (ref >40)
LDL Cholesterol: 139 mg/dL — ABNORMAL HIGH (ref 0–99)
Total CHOL/HDL Ratio: 2.6 RATIO
Triglycerides: 60 mg/dL (ref <150)
VLDL: 12 mg/dL (ref 0–40)

## 2021-03-07 LAB — HEMOGLOBIN A1C
Hgb A1c MFr Bld: 5.2 % (ref 4.8–5.6)
Mean Plasma Glucose: 103 mg/dL

## 2021-03-07 SURGERY — LOOP RECORDER INSERTION

## 2021-03-07 MED ORDER — ATORVASTATIN CALCIUM 80 MG PO TABS: 80.0000 mg | ORAL_TABLET | Freq: Every day | ORAL | 1 refills | Status: DC

## 2021-03-07 MED ORDER — ASPIRIN EC 81 MG PO TBEC: 81.0000 mg | DELAYED_RELEASE_TABLET | Freq: Every day | ORAL | 0 refills | Status: DC

## 2021-03-07 MED ORDER — CLOPIDOGREL BISULFATE 75 MG PO TABS: 75.0000 mg | ORAL_TABLET | Freq: Every day | ORAL | Status: DC

## 2021-03-07 MED ORDER — ATORVASTATIN CALCIUM 80 MG PO TABS
80.0000 mg | ORAL_TABLET | Freq: Every day | ORAL | Status: DC
  Administered 2021-03-07: 80 mg via ORAL
  Filled 2021-03-07: qty 1

## 2021-03-07 MED ORDER — LIDOCAINE-EPINEPHRINE 1 %-1:100000 IJ SOLN
INTRAMUSCULAR | Status: DC | PRN
  Administered 2021-03-07: 20 mL

## 2021-03-07 MED ORDER — LIDOCAINE-EPINEPHRINE 1 %-1:100000 IJ SOLN
INTRAMUSCULAR | Status: AC
  Filled 2021-03-07: qty 1

## 2021-03-07 MED ORDER — CLOPIDOGREL BISULFATE 75 MG PO TABS
300.0000 mg | ORAL_TABLET | Freq: Once | ORAL | Status: AC
  Administered 2021-03-07: 300 mg via ORAL
  Filled 2021-03-07: qty 4

## 2021-03-07 MED ORDER — CLOPIDOGREL BISULFATE 75 MG PO TABS: 75.0000 mg | ORAL_TABLET | Freq: Every day | ORAL | 1 refills | Status: DC

## 2021-03-07 SURGICAL SUPPLY — 2 items
PACK LOOP INSERTION (CUSTOM PROCEDURE TRAY) ×3 IMPLANT
SYSTEM MONITOR REVEAL LINQ II (Prosthesis & Implant Heart) ×1 IMPLANT

## 2021-03-07 NOTE — Discharge Summary (Signed)
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 0914    Microbiology Recent Results (from the past 240 hour(s))  Resp Panel by RT-PCR (Flu A&B, Covid) Nasopharyngeal Swab     Status: None   Collection Time: 03/06/21 10:30 AM   Specimen: Nasopharyngeal Swab; Nasopharyngeal(NP) swabs in vial transport medium  Result Value Ref Range Status   SARS Coronavirus 2 by RT PCR NEGATIVE NEGATIVE Final    Comment: (NOTE) SARS-CoV-2 target nucleic acids are NOT DETECTED.  The SARS-CoV-2 RNA is generally detectable in upper respiratory specimens during the acute phase of infection. The lowest concentration of SARS-CoV-2 viral copies this assay can detect is 138 copies/mL. A negative result does not preclude SARS-Cov-2 infection and should not be used as the sole basis for treatment or other patient management decisions. A negative result may occur with  improper specimen collection/handling, submission of  specimen other than nasopharyngeal swab, presence of viral mutation(s) within the areas targeted by this assay, and inadequate number of viral copies(<138 copies/mL). A negative result must be combined with clinical observations, patient history, and epidemiological information. The expected result is Negative.  Fact Sheet for Patients:  EntrepreneurPulse.com.au  Fact Sheet for Healthcare Providers:  IncredibleEmployment.be  This test is no t yet approved or cleared by the [place] FDA and  has been authorized for detection and/or diagnosis of SARS-CoV-2 by FDA under an Emergency Use Authorization (EUA). This EUA will remain  in effect (meaning this test can be used) for the duration of the COVID-19 declaration under Section 564(b)(1) of  the Act, 21 U.S.C.section 360bbb-3(b)(1), unless the authorization is terminated  or revoked sooner.       Influenza A by PCR NEGATIVE NEGATIVE Final   Influenza B by PCR NEGATIVE NEGATIVE Final    Comment: (NOTE) The Xpert Xpress SARS-CoV-2/FLU/RSV plus assay is intended as an aid in the diagnosis of influenza from Nasopharyngeal swab specimens and should not be used as a sole basis for treatment. Nasal washings and aspirates are unacceptable for Xpert Xpress SARS-CoV-2/FLU/RSV testing.  Fact Sheet for Patients: EntrepreneurPulse.com.au  Fact Sheet for Healthcare Providers: IncredibleEmployment.be  This test is not yet approved or cleared by the [place] FDA and has been authorized for detection and/or diagnosis of SARS-CoV-2 by FDA under an Emergency Use Authorization (EUA). This EUA will remain in effect (meaning this test can be used) for the duration of the COVID-19 declaration under Section 564(b)(1) of the Act, 21 U.S.C. section 360bbb-3(b)(1), unless the authorization is terminated or revoked.  Performed at [facility], [place] [address]  [place], [place] [address]     Time coordinating discharge: Approximately 40 minutes  Patrecia Pour, MD  [facility] 03/07/2021, 2:25 PM
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
## 2021-03-07 NOTE — Plan of Care (Signed)
°  Problem: Education: Goal: Knowledge of General Education information will improve Description: Including pain rating scale, medication(s)/side effects and non-pharmacologic comfort measures Outcome: Progressing   Problem: Health Behavior/Discharge Planning: Goal: Ability to manage health-related needs will improve Outcome: Progressing   Problem: Clinical Measurements: Goal: Ability to maintain clinical measurements within normal limits will improve Outcome: Progressing Goal: Will remain free from infection Outcome: Progressing Goal: Diagnostic test results will improve Outcome: Progressing Goal: Respiratory complications will improve Outcome: Progressing Goal: Cardiovascular complication will be avoided Outcome: Progressing   Problem: Activity: Goal: Risk for activity intolerance will decrease Outcome: Progressing   Problem: Nutrition: Goal: Adequate nutrition will be maintained Outcome: Progressing   Problem: Coping: Goal: Level of anxiety will decrease Outcome: Progressing   Problem: Elimination: Goal: Will not experience complications related to bowel motility Outcome: Progressing Goal: Will not experience complications related to urinary retention Outcome: Progressing   Problem: Pain Managment: Goal: General experience of comfort will improve Outcome: Progressing   Problem: Safety: Goal: Ability to remain free from injury will improve Outcome: Progressing   Problem: Skin Integrity: Goal: Risk for impaired skin integrity will decrease Outcome: Progressing   Problem: Education: Goal: Knowledge of disease or condition will improve Outcome: Progressing Goal: Knowledge of secondary prevention will improve (SELECT ALL) Outcome: Progressing Goal: Knowledge of patient specific risk factors will improve (INDIVIDUALIZE FOR PATIENT) Outcome: Progressing Goal: Individualized Educational Video(s) Outcome: Progressing   Problem: Coping: Goal: Will verbalize  positive feelings about self Outcome: Progressing Goal: Will identify appropriate support needs Outcome: Progressing   Problem: Health Behavior/Discharge Planning: Goal: Ability to manage health-related needs will improve Outcome: Progressing   Problem: Self-Care: Goal: Ability to participate in self-care as condition permits will improve Outcome: Progressing Goal: Ability to communicate needs accurately will improve Outcome: Progressing   Problem: Nutrition: Goal: Risk of aspiration will decrease Outcome: Progressing Goal: Dietary intake will improve Outcome: Progressing   Problem: Intracerebral Hemorrhage Tissue Perfusion: Goal: Complications of Intracerebral Hemorrhage will be minimized Outcome: Progressing   Problem: Ischemic Stroke/TIA Tissue Perfusion: Goal: Complications of ischemic stroke/TIA will be minimized Outcome: Progressing   Problem: Spontaneous Subarachnoid Hemorrhage Tissue Perfusion: Goal: Complications of Spontaneous Subarachnoid Hemorrhage will be minimized Outcome: Progressing

## 2021-03-07 NOTE — TOC Transition Note (Signed)
Transition of Care [facility]) - CM/SW Discharge Note   Patient Details  Name: ABBIEGAIL [name] MRN: [id number] Date of Birth: 10-01-1955  Transition of [facility]) CM/SW Contact:  Kermit Balo, RN Phone Number: 03/07/2021, 2:31 PM   Clinical Narrative:    Patient is discharging home with outpatient therapy through [facility]. Information on the AVS.  No DME needs.  Pt is responsible for her home medications and transportation. She states her niece and sister will provide intermittent supervision.  Pt has transport home.    Final next level of care: OP Rehab Barriers to Discharge: No Barriers Identified   Patient Goals and CMS Choice     Choice offered to / list presented to : Patient  Discharge Placement                       Discharge Plan and Services                                     Social Determinants of Health (SDOH) Interventions     Readmission Risk Interventions No flowsheet data found.

## 2021-03-07 NOTE — Care Management Obs Status (Signed)
MEDICARE OBSERVATION STATUS NOTIFICATION   Patient Details  Name: Kristy [name] MRN: [id number] Date of Birth: 10-27-1955   Medicare Observation Status Notification Given:  Yes    Kermit Balo, RN 03/07/2021, 2:34 PM

## 2021-03-07 NOTE — Consult Note (Signed)
ELECTROPHYSIOLOGY CONSULT NOTE  Patient ID: Kristy [name] MRN: [id number], DOB/AGE: [birth year]   Admit date: 03/06/2021 Date of Consult: 03/07/2021  Primary Physician: Willey Blade, MD Primary Cardiologist: none Reason for Consultation: Cryptogenic stroke ; recommendations regarding Implantable Loop Recorder, requested by Dr. Leonie Man  History of Present Illness Kristy [name] was admitted on 03/06/2021 with dizziness (chronically) had an out pt MRI to further work this up and noted small, patchy, acute cortical left superior occipital lobe infarction.    PMHx includes: dizziness/migraine HAs, HLD, sarcoidosis (pulmonary)  IM reached out requesting loop prior to discharge, planned for today, have spoken to Dr. Leonie Man who confirms their request for loop for cryptogenic stroke.  she has undergone workup for stroke including echocardiogram and carotid dopplers/angio.  The patient has been monitored on telemetry which has demonstrated sinus rhythm with no arrhythmias.    Neurology has deferred TEE  Echocardiogram this admission demonstrated  IMPRESSIONS   1. Left ventricular ejection fraction, by estimation, is 55 to 60%. The  left ventricle has normal function. The left ventricle has no regional  wall motion abnormalities. Left ventricular diastolic parameters are  consistent with Grade I diastolic  dysfunction (impaired relaxation).   2. Right ventricular systolic function is normal. The right ventricular  size is normal.   3. The mitral valve is normal in structure. Trivial mitral valve  regurgitation. No evidence of mitral stenosis.   4. The aortic valve is tricuspid. Aortic valve regurgitation is mild. No  aortic stenosis is present.   5. The inferior vena cava is normal in size with greater than 50%  respiratory variability, suggesting right atrial pressure of 3 mmHg.   Comparison(s): No prior Echocardiogram.   Lab work is reviewed.   Prior to admission, the patient denies  chest pain, shortness of breath,  She reports a long standing hx of dizziness and remotely of syncope Dizziness has largely been positional, supine worse, movement makes it worse as well, associated with nausea/vomiting, long felt to be vertigo With her severe "attacks" she can be bed bound for a day or longer, these can be associated with palpitations.  These perhaps a couple times a year They are recovering from their stroke with plans to home at discharge.      Past Medical History:  Diagnosis Date   Dizziness    Dizziness and giddiness    Hyperlipidemia    Migraine    Sarcoidosis    Stress incontinence      Surgical History:  Past Surgical History:  Procedure Laterality Date   BLADDER SUSPENSION N/A 12/07/2019   Procedure: TRANSVAGINAL TAPE (TVT) PROCEDURE;  Surgeon: Everett Graff, MD;  Location: [place];  Service: Gynecology;  Laterality: N/A;   CYSTOSCOPY N/A 12/07/2019   Procedure: CYSTOSCOPY;  Surgeon: Everett Graff, MD;  Location: [place];  Service: Gynecology;  Laterality: N/A;   NO PAST SURGERIES       Medications Prior to Admission  Medication Sig Dispense Refill Last Dose   Ascorbic Acid (VITA-C PO) Take 1,000 mg by mouth daily.   03/05/2021   aspirin EC 81 MG tablet Take 81 mg by mouth daily. Swallow whole.   03/05/2021   Aspirin Effervescent (ALKA-SELTZER ORIGINAL PO) Take 2 tablets by mouth daily as needed (allergies).   unk   B Complex-C (SUPER B COMPLEX PO) Take by mouth.   03/05/2021   cetirizine (ZYRTEC) 10 MG tablet Take 10 mg by mouth daily.   03/05/2021   cholecalciferol (VITAMIN D3)  25 MCG (1000 UNIT) tablet Take 1,000 Units by mouth daily.   03/05/2021   Cyanocobalamin (VITAMIN B 12 PO) Take 5,000 mcg by mouth daily.   03/05/2021   Docusate Sodium (STOOL SOFTENER LAXATIVE PO) Take 200 mg by mouth daily.   03/05/2021   fluticasone (FLONASE) 50 MCG/ACT nasal spray Place 1 spray into both nostrils daily.   03/05/2021   HYDROcodone-acetaminophen (NORCO/VICODIN)  5-325 MG tablet Take 1 tablet by mouth every 6 (six) hours as needed for moderate pain or severe pain. 20 tablet 0 02/21/2021   ibuprofen (ADVIL) 600 MG tablet take 1 tablet po pc every 6 hours for 5 days then prn-post operative pain 30 tablet 1 unk   Magnesium Oxide (MAG-OX PO) Take 400 mg by mouth 2 (two) times daily.   03/05/2021   meclizine (ANTIVERT) 25 MG tablet Take 25 mg by mouth 3 (three) times daily as needed for dizziness.   Past Week   meloxicam (MOBIC) 15 MG tablet Take 15 mg by mouth daily.   03/05/2021   mirabegron ER (MYRBETRIQ) 25 MG TB24 tablet Take 25 mg by mouth daily.   03/05/2021   Omega-3 Fatty Acids (FISH OIL) 1000 MG CAPS Take 1,000 mg by mouth daily.   03/05/2021   ondansetron (ZOFRAN) 8 MG tablet Take 1 tablet (8 mg total) by mouth every 8 (eight) hours as needed for nausea or vomiting. 20 tablet 2 02/21/2021   OVER THE COUNTER MEDICATION Apply 1 application topically daily as needed (pain). magn[place] balm   unk   oxymetazoline (AFRIN) 0.05 % nasal spray Place 1 spray into both nostrils daily as needed for congestion.   unk   phentermine 37.5 MG capsule Take 37.5 mg by mouth every morning.   03/05/2021   PRESCRIPTION MEDICATION Inject 1 Dose as directed every Monday. Lipotropic injection   03/04/2021   UNABLE TO FIND Take 1 tablet by mouth daily. Med Name: Fiber well gummy's   03/05/2021   valACYclovir (VALTREX) 500 MG tablet Take 500 mg by mouth daily as needed (fever blisters).   unk    Inpatient Medications:    stroke: mapping our early stages of recovery book   Does not apply Once   aspirin EC  81 mg Oral Daily   [START ON 03/08/2021] clopidogrel  75 mg Oral Daily    Allergies: No Known Allergies  Social History   Socioeconomic History   Marital status: Single    Spouse name: Not on file   Number of children: 0   Years of education: Not on file   Highest education level: Master's degree (e.g., MA, MS, MEng, MEd, MSW, MBA)  Occupational History    Comment: [employer]   Tobacco Use   Smoking status: Never   Smokeless tobacco: Never  Vaping Use   Vaping Use: Never used  Substance and Sexual Activity   Alcohol use: No   Drug use: No   Sexual activity: Never    Birth control/protection: None  Other Topics Concern   Not on file  Social History Narrative   Right handed   Caffeine- 2 soft drinks per day   Lives at home alone   Social Determinants of Health   Financial Resource Strain: Not on file  Food Insecurity: Not on file  Transportation Needs: Not on file  Physical Activity: Not on file  Stress: Not on file  Social Connections: Not on file  Intimate Partner Violence: Not on file     Family History  Problem Relation  Age of Onset   Alzheimer's disease Mother    COPD Father    Breast cancer Maternal Aunt    Breast cancer Paternal Aunt       Review of Systems: All other systems reviewed and are otherwise negative except as noted above.  Physical Exam: Vitals:   03/06/21 2021 03/06/21 2312 03/07/21 0349 03/07/21 0823  BP: 123/61 99/84 136/63 112/82  Pulse: 84 76 62 86  Resp: 16 17 12 18   Temp: 97.6 F (36.4 C) 97.7 F (36.5 C) 97.8 F (36.6 C) 98.4 F (36.9 C)  TempSrc: Oral Oral Oral Oral  SpO2: 93% 95% 99% 100%  Height:        GEN- The patient is well appearing, alert and oriented x 3 today.   Head- normocephalic, atraumatic Eyes-  Sclera clear, conjunctiva pink Ears- hearing intact Oropharynx- clear Neck- supple Lungs- CTA b/l, normal work of breathing Heart- RRR, no murmurs, rubs or gallops  GI- soft, NT, ND Extremities- no clubbing, cyanosis, or edema MS- no significant deformity or atrophy Skin- no rash or lesion Psych- euthymic mood, full affect   Labs:   Lab Results  Component Value Date   WBC 3.3 (L) 03/06/2021   HGB 13.8 03/06/2021   HCT 40.4 03/06/2021   MCV 86.5 03/06/2021   PLT 303 03/06/2021    Recent Labs  Lab 03/06/21 0920 03/06/21 1120  NA 141  --   K 4.0  --   CL 107  --   CO2 27   --   BUN 15  --   CREATININE 0.76  --   CALCIUM 9.2  --   PROT  --  6.3*  BILITOT  --  0.5  ALKPHOS  --  85  ALT  --  16  AST  --  18  GLUCOSE 99  --    No results found for: CKTOTAL, CKMB, CKMBINDEX, TROPONINI Lab Results  Component Value Date   CHOL 244 (H) 03/07/2021   Lab Results  Component Value Date   HDL 93 03/07/2021   Lab Results  Component Value Date   LDLCALC 139 (H) 03/07/2021   Lab Results  Component Value Date   TRIG 60 03/07/2021   Lab Results  Component Value Date   CHOLHDL 2.6 03/07/2021   No results found for: LDLDIRECT  No results found for: DDIMER   Radiology/Studies:  CT ANGIO HEAD NECK W WO CM Result Date: 03/07/2021 CLINICAL DATA:  Follow-up examination for acute stroke. EXAM: CT ANGIOGRAPHY HEAD AND NECK TECHNIQUE: Multidetector CT imaging of the head and neck was performed using the standard protocol during bolus administration of intravenous contrast. Multiplanar CT image reconstructions and MIPs were obtained to evaluate the vascular anatomy. Carotid stenosis measurements (when applicable) are obtained utilizing NASCET criteria, using the distal internal carotid diameter as the denominator. RADIATION DOSE REDUCTION: This exam was performed according to the departmental dose-optimization program which includes automated exposure control, adjustment of the mA and/or kV according to patient size and/or use of iterative reconstruction technique. CONTRAST:  23mL OMNIPAQUE IOHEXOL 350 MG/ML SOLN COMPARISON:  Prior MRI from earlier the same day. FINDINGS: CT HEAD FINDINGS Brain: Cerebral volume within normal limits for age. Patchy cerebral white matter disease, likely related chronic microvascular ischemic disease, moderate in nature. Previously identified acute ischemic left occipital infarct again seen, grossly stable from prior MRI. No mass effect or associated hemorrhage. No other acute large vessel territory infarct. No intracranial hemorrhage elsewhere  within the brain. No mass  lesion or midline shift. No hydrocephalus or extra-axial fluid collection. Vascular: No hyperdense vessel. Scattered vascular calcifications noted within the carotid siphons. Skull: Scalp soft tissues and calvarium within normal limits. Sinuses: Clear. Orbits: Unremarkable. Review of the MIP images confirms the above findings CTA NECK FINDINGS Aortic arch: Visualized aortic arch normal in caliber with normal branch pattern. No stenosis about the origin the great vessels. Right carotid system: Right common and internal carotid arteries patent without stenosis, dissection, or occlusion. Left carotid system: Left common and internal carotid arteries patent without stenosis, dissection or occlusion. Vertebral arteries: Both vertebral arteries arise from subclavian arteries. Vertebral arteries patent without stenosis, dissection or occlusion. Skeleton: No discrete or worrisome osseous lesions. Moderate spondylosis at C3-4 through C6-7. Other neck: No other acute soft tissue abnormality within the neck. 2.9 cm benign lipoma centered at the approximate 2.9 cm lipoma noted centered at the right parapharyngeal space. Upper chest: Postsurgical scarring within asked Modic suture present at the right lung. Scattered mediastinal and hilar adenopathy the with irregular peribronchovascular markings throughout the visualized lungs, consistent with history of sarcoidosis. Review of the MIP images confirms the above findings CTA HEAD FINDINGS Anterior circulation: Petrous segments patent bilaterally. Mild atheromatous change within the carotid siphons without significant stenosis. A1 segments patent bilaterally. Normal anterior communicating artery complex. Anterior cerebral arteries patent without stenosis. No M1 stenosis or occlusion. Normal MCA bifurcations. Distal MCA branches well perfused and symmetric. Posterior circulation: Both vertebral arteries patent to the vertebrobasilar junction without  stenosis. Left vertebral artery dominant. Right PICA origin patent. Left PICA origin not well seen. Basilar patent to its distal aspect without stenosis. Superior cerebellar arteries patent bilaterally. Right PCA primarily supplied via the basilar. Left PCA supplied via a hypoplastic left P1 segment and robust left posterior communicating artery. Both PCAs patent to their distal aspects without stenosis. Venous sinuses: Grossly patent allowing for timing the contrast bolus. Anatomic variants: None significant.  No aneurysm. Review of the MIP images confirms the above findings IMPRESSION: CT HEAD IMPRESSION: 1. Stable size and appearance of evolving left occipital lobe infarct. No associated hemorrhage or regional mass effect. 2. No other new acute intracranial abnormality. 3. Underlying moderate cerebral white matter disease, likely related chronic microvascular ischemic disease. CTA HEAD AND NECK IMPRESSION: 1. Negative CTA of the head and neck. No large vessel occlusion. Mild atheromatous disease for age without hemodynamically significant or correctable stenosis. 2. Mediastinal and hilar adenopathy with irregular peribronchovascular markings throughout the visualized lungs, consistent with history of sarcoidosis. Electronically Signed   By: Jeannine Boga M.D.   On: 03/07/2021 01:09    MR BRAIN W WO CONTRAST Result Date: 03/06/2021 CLINICAL DATA:  66 year old female with dizziness, vertigo. Outpatient exam, patient referred to emergency department for stroke workup, and I was contacted by neural hospitalist regarding the findings on this exam at 1031 hours on 03/06/2021. EXAM: MRI HEAD WITHOUT AND WITH CONTRAST TECHNIQUE: Multiplanar, multiecho pulse sequences of the brain and surrounding structures were obtained without and with intravenous contrast. CONTRAST:  80mL MULTIHANCE GADOBENATE DIMEGLUMINE 529 MG/ML IV SOLN COMPARISON:  Head CT 03/10/2016. FINDINGS: Brain: Patchy, roughly 15 mm area of  restricted diffusion in the left superior occipital lobe just below the parieto-occipital sulcus (series 9, image 37 and series 5, image 19) affecting cortex and subcortical white matter. Associated T2 and FLAIR hyperintensity. No T2* or SWI imaging, but no evidence of hemorrhage. No enhancement following contrast. No other restricted diffusion. No midline shift, mass effect, evidence of mass  lesion, ventriculomegaly, extra-axial collection. Cervicomedullary junction and pituitary are within normal limits. Widely scattered but generally small cerebral white matter T2 and FLAIR hyperintensity in both hemispheres, nonspecific pattern. No cortical encephalomalacia identified. Deep gray matter nuclei within normal limits. Mild patchy T2 hyperintensity in the pons greater on the right. Cerebellum within normal limits. No abnormal enhancement identified.  No dural thickening identified. Vascular: Major intracranial vascular flow voids are preserved. The major dural venous sinuses seem to be enhancing (no precontrast axial T1) and patent. Skull and upper cervical spine: Cervical spine disc and endplate degeneration with no visible spinal stenosis. Visualized bone marrow signal is within normal limits. Sinuses/Orbits: Negative orbits. Paranasal Visualized paranasal sinuses and mastoids are clear. Other: Visible internal auditory structures appear normal. Normal stylomastoid foramina. Negative visible scalp and face. IMPRESSION: 1. Small patchy acute cortical infarct in the left superior occipital lobe. No associated hemorrhage or mass effect. 2. No other acute intracranial abnormality. Moderate for age nonspecific signal changes in the cerebral white matter and pons, most commonly due to chronic small vessel disease. 3. Study discussed by telephone with Dr. Derry Lory on 03/06/2021 at 1032 hours. Electronically Signed   By: Odessa Fleming M.D.   On: 03/06/2021 10:40      VAS US CAROTID Result Date: 03/06/2021 Carotid Arterial  Duplex Study Patient Name:  NELVA HOAR  Date of Exam:   03/06/2021 Medical Rec #: [id number]      Accession #:    [id number] Date of Birth: 1955-07-22      Patient Gender: F Patient Age:   46 years Exam [facility] Procedure:      VAS [place] CAROTID Referring Phys: Terie Purser DAVID --------------------------------------------------------------------------------  Indications:       CVA. Risk Factors:      Hyperlipidemia. Comparison Study:  No prior studies. Performing Technologist: Jean Rosenthal RDMS, RVT  Examination Guidelines: A complete evaluation includes B-mode imaging, spectral Doppler, color Doppler, and power Doppler as needed of all accessible portions of each vessel. Bilateral testing is considered an integral part of a complete examination. Limited examinations for reoccurring indications may be performed as noted.   Summary: Right Carotid: The extracranial vessels were near-normal with only minimal wall                thickening or plaque. Left Carotid: The extracranial vessels were near-normal with only minimal wall               thickening or plaque. Vertebrals:  Bilateral vertebral arteries demonstrate antegrade flow. Subclavians: Normal flow hemodynamics were seen in bilateral subclavian              arteries. *See table(s) above for measurements and observations.     Preliminary     12-lead ECG SR All prior EKG's in EPIC reviewed with no documented atrial fibrillation  Telemetry ST/ST  Assessment and Plan:  1. Cryptogenic stroke The patient presents with cryptogenic stroke.  T I spoke at length with the patient about monitoring for afib with either a 30 day event monitor or an implantable loop recorder.  Risks, benefits, and alteratives to implantable loop recorder were discussed with the patient today.   At this time, the patient is very clear in their decision to proceed with implantable loop recorder.   Wound care was reviewed with the patient (keep incision clean and dry  for 3 days).  Wound check follow up will be scheduled for the patient.  Please call with questions.  Shady Bradish Dyane Dustman, PA-C 03/07/2021

## 2021-03-07 NOTE — Evaluation (Signed)
Occupational Therapy Evaluation Patient Details Name: Kristy [name] MRN: [id number] DOB: [birth year] Today's Date: 03/07/2021   History of Present Illness Kristy [name] is a 66 y.o. female with medical history significant of vertigo for years was sent to a neurologist as an outpatient secondary to markedly worsening of her vertigo and dizziness symptoms for over a week by her primary care physician.  Neurologist ordered MRI which did in fact show small patchy acute cortical infarct in the left superior occipital lobe.   Clinical Impression   Pt admitted for concerns listed above. PTA pt reported that she was independent with all ADL's and IADL's, including yard work and caring for her sister. At this time, pt presents at her baseline, able to complete all ADL's independently. Prior to OT evaluation, pt participated in a BPPV session and reports that she does not feel dizzy at this time. Pt was educated on safety techniques and fall prevention techniques for home. She has no further OT needs and acute OT will sign off.        Recommendations for follow up therapy are one component of a multi-disciplinary discharge planning process, led by the attending physician.  Recommendations may be updated based on patient status, additional functional criteria and insurance authorization.   Follow Up Recommendations  No OT follow up    Assistance Recommended at Discharge PRN  Patient can return home with the following      Functional Status Assessment  Patient has had a recent decline in their functional status and demonstrates the ability to make significant improvements in function in a reasonable and predictable amount of time.  Equipment Recommendations  None recommended by OT    Recommendations for Other Services       Precautions / Restrictions Precautions Precautions: Fall Restrictions Weight Bearing Restrictions: No      Mobility Bed Mobility Overal bed mobility: Independent                   Transfers Overall transfer level: Modified independent Equipment used: None               General transfer comment: Up in room with no assist      Balance Overall balance assessment: Mild deficits observed, not formally tested                                         ADL either performed or assessed with clinical judgement   ADL Overall ADL's : Modified independent                                       General ADL Comments: No difficulties with any ADL's at this time, pt able to complete all BADL's independently. Educated to have a safety plan/somewhere to sit in every room if she continues to have dizzy spells.     Vision Baseline Vision/History: 0 No visual deficits Ability to See in Adequate Light: 0 Adequate Patient Visual Report: Other (comment) (Dizziness) Vision Assessment?: Vision impaired- to be further tested in functional context Additional Comments: pt with wooziness/dizziness     Perception     Praxis      Pertinent Vitals/Pain Pain Assessment Pain Assessment: No/denies pain     Hand Dominance Right   Extremity/Trunk Assessment Upper Extremity Assessment Upper Extremity Assessment:  Overall [facility] for tasks assessed   Lower Extremity Assessment Lower Extremity Assessment: Overall WFL for tasks assessed   Cervical / Trunk Assessment Cervical / Trunk Assessment: Normal   Communication Communication Communication: No difficulties   Cognition Arousal/Alertness: Awake/alert Behavior During Therapy: WFL for tasks assessed/performed Overall Cognitive Status: Within Functional Limits for tasks assessed                                       General Comments  VSS on RA,    Exercises     Shoulder Instructions      Home Living Family/patient expects to be discharged to:: Private residence Living Arrangements: Alone Available Help at Discharge: Family Type of Home: House Home  Access: Stairs to enter Secretary/administrator of Steps: 4 Entrance Stairs-Rails: Right Home Layout: One level     Bathroom Shower/Tub: Producer, television/film/video: Handicapped height     Home Equipment: Shower seat - built in;Grab bars - tub/shower;Hand held shower head          Prior Functioning/Environment Prior Level of Function : Independent/Modified Independent             Mobility Comments: has had episodes of stumbling or LOB, but no falls ADLs Comments: drives, cooks, cleans, grocery shops, takes care of sister        OT Problem List: Impaired balance (sitting and/or standing);Impaired vision/perception      OT Treatment/Interventions:      OT Goals(Current goals can be found in the care plan section) Acute Rehab OT Goals Patient Stated Goal: To go home OT Goal Formulation: All assessment and education complete, DC therapy Time For Goal Achievement: 03/07/21 Potential to Achieve Goals: Good  OT Frequency:      Co-evaluation              AM-PAC OT "6 Clicks" Daily Activity     Outcome Measure Help from another person eating meals?: None Help from another person taking care of personal grooming?: None Help from another person toileting, which includes using toliet, bedpan, or urinal?: None Help from another person bathing (including washing, rinsing, drying)?: None Help from another person to put on and taking off regular upper body clothing?: None Help from another person to put on and taking off regular lower body clothing?: None 6 Click Score: 24   End of Session Nurse Communication: Mobility status  Activity Tolerance: Patient tolerated treatment well Patient left: in bed;with call bell/phone within reach  OT Visit Diagnosis: Muscle weakness (generalized) (M62.81);BPPV                Time: 2010-0712 OT Time Calculation (min): 14 min Charges:  OT General Charges $OT Visit: 1 Visit OT Evaluation $OT Eval Moderate Complexity: 1  Mod  Zianne Schubring H., OTR/L Acute Rehabilitation  Kristy [name] Elane Bing Plume 03/07/2021, 4:43 PM

## 2021-03-07 NOTE — Plan of Care (Signed)
°  Problem: Education: Goal: Knowledge of General Education information will improve Description: Including pain rating scale, medication(s)/side effects and non-pharmacologic comfort measures Outcome: Adequate for Discharge   Problem: Health Behavior/Discharge Planning: Goal: Ability to manage health-related needs will improve Outcome: Adequate for Discharge   Problem: Clinical Measurements: Goal: Ability to maintain clinical measurements within normal limits will improve Outcome: Adequate for Discharge Goal: Will remain free from infection Outcome: Adequate for Discharge Goal: Diagnostic test results will improve Outcome: Adequate for Discharge Goal: Respiratory complications will improve Outcome: Adequate for Discharge Goal: Cardiovascular complication will be avoided Outcome: Adequate for Discharge   Problem: Activity: Goal: Risk for activity intolerance will decrease Outcome: Adequate for Discharge   Problem: Nutrition: Goal: Adequate nutrition will be maintained Outcome: Adequate for Discharge   Problem: Coping: Goal: Level of anxiety will decrease Outcome: Adequate for Discharge   Problem: Elimination: Goal: Will not experience complications related to bowel motility Outcome: Adequate for Discharge Goal: Will not experience complications related to urinary retention Outcome: Adequate for Discharge   Problem: Pain Managment: Goal: General experience of comfort will improve Outcome: Adequate for Discharge   Problem: Safety: Goal: Ability to remain free from injury will improve Outcome: Adequate for Discharge   Problem: Skin Integrity: Goal: Risk for impaired skin integrity will decrease Outcome: Adequate for Discharge   Problem: Education: Goal: Knowledge of disease or condition will improve Outcome: Adequate for Discharge Goal: Knowledge of secondary prevention will improve (SELECT ALL) Outcome: Adequate for Discharge Goal: Knowledge of patient specific  risk factors will improve (INDIVIDUALIZE FOR PATIENT) Outcome: Adequate for Discharge Goal: Individualized Educational Video(s) Outcome: Adequate for Discharge   Problem: Coping: Goal: Will verbalize positive feelings about self Outcome: Adequate for Discharge Goal: Will identify appropriate support needs Outcome: Adequate for Discharge   Problem: Health Behavior/Discharge Planning: Goal: Ability to manage health-related needs will improve Outcome: Adequate for Discharge   Problem: Self-Care: Goal: Ability to participate in self-care as condition permits will improve Outcome: Adequate for Discharge Goal: Ability to communicate needs accurately will improve Outcome: Adequate for Discharge   Problem: Nutrition: Goal: Risk of aspiration will decrease Outcome: Adequate for Discharge Goal: Dietary intake will improve Outcome: Adequate for Discharge   Problem: Intracerebral Hemorrhage Tissue Perfusion: Goal: Complications of Intracerebral Hemorrhage will be minimized Outcome: Adequate for Discharge   Problem: Ischemic Stroke/TIA Tissue Perfusion: Goal: Complications of ischemic stroke/TIA will be minimized Outcome: Adequate for Discharge   Problem: Spontaneous Subarachnoid Hemorrhage Tissue Perfusion: Goal: Complications of Spontaneous Subarachnoid Hemorrhage will be minimized Outcome: Adequate for Discharge

## 2021-03-07 NOTE — Evaluation (Signed)
Physical Therapy Evaluation Patient Details Name: Kristy [name] MRN: [id number] DOB: 1955/09/26 Today's Date: 03/07/2021  History of Present Illness  Kristy [name] is a 66 y.o. female with medical history significant of vertigo for years was sent to a neurologist as an outpatient secondary to markedly worsening of her vertigo and dizziness symptoms for over a week by her primary care physician.  Neurologist ordered MRI which did in fact show small patchy acute cortical infarct in the left superior occipital lobe.  Clinical Impression  Patient presents with mobility impacted by her dizziness.  She presents during vestibular evaluation today with symptoms of L vestibular hypofunction as well as possible L side BPPV.  Treated with Eply for L BPPV, but feel the length of time she has dealt with symptoms she needs follow up outpatient vestibular rehab.  No further acute PT as likely for d/c today.    Vestibular Assessment - 03/07/21 0001       Symptom Behavior   Subjective history of current problem Reports 10 year history of vertigo that started as spinning and developed into intermittent episodes at dentist, at hairdresser that would last several moments.  Also having some issues unable to sleep flat or on her L side as well as difficulty with sleep needing it very cool in the room.  She states her at her baseline until about 3 weeks ago then started vertigo symptoms that would wake her at night.  States primary MD referred to neuro MD who sent for MRI which was positive for post circulation stroke.    Type of Dizziness  Spinning;Unsteady with head/body turns;Comment   nausea   Frequency of Dizziness intermittent    Duration of Dizziness minutes    Symptom Nature Intermittent;Variable;Spontaneous;Positional    Aggravating Factors Rolling to left;Spontaneous onset;Lying supine    Relieving Factors Slow movements;Comments   lying with head elevated, getting out of provoking position   Progression of  Symptoms Better      Oculomotor Exam   Oculomotor Alignment Normal   but with head tipped to R   Ocular ROM WFL    Spontaneous Absent    Gaze-induced  Absent    Head shaking Horizontal R beating nystagmus   with gaze to R after HHST   Smooth Pursuits Intact    Saccades Intact;Slow      Oculomotor Exam-Fixation Suppressed    Left Head Impulse positive for refixation saccade    Right Head Impulse negative      Vestibulo-Ocular Reflex   VOR 1 Head Only (x 1 viewing) no symptoms or issues with horizontal or vertical head movements    VOR Cancellation Corrective saccades   with head turning to R     Auditory   Comments intact and equal to scratch test bilateral      Positional Testing   Sidelying Test Sidelying Right;Sidelying Left    Horizontal Canal Testing Horizontal Canal Right;Horizontal Canal Left      Sidelying Right   Sidelying Right Duration 30 sec    Sidelying Right Symptoms No nystagmus   with mild c/o feeling uncomfortable in the position     Sidelying Left   Sidelying Left Duration 45 sec    Sidelying Left Symptoms Upbeat, left rotatory nystagmus   initiated only after in position about 10-12 sec then resolved after about 6-7 sec     Horizontal Canal Right   Horizontal Canal Right Duration 1 minute    Horizontal Canal Right Symptoms Normal  Horizontal Canal Left   Horizontal Canal Left Duration 1 minute    Horizontal Canal Left Symptoms Normal   though c/o mild symptoms                  Recommendations for follow up therapy are one component of a multi-disciplinary discharge planning process, led by the attending physician.  Recommendations may be updated based on patient status, additional functional criteria and insurance authorization.  Follow Up Recommendations Home health PT    Assistance Recommended at Discharge None  Patient can return home with the following  Help with stairs or ramp for entrance    Equipment Recommendations None  recommended by PT  Recommendations for Other Services       Functional Status Assessment Patient has had a recent decline in their functional status and demonstrates the ability to make significant improvements in function in a reasonable and predictable amount of time.     Precautions / Restrictions Precautions Precautions: Fall      Mobility  Bed Mobility                    Transfers Overall transfer level: Modified independent                 General transfer comment: up to standing from EOB prior to set up, but focus of session on vestibular eval, reports walking on her own to the bathroom    Ambulation/Gait                  Stairs            Wheelchair Mobility    Modified Rankin (Stroke Patients Only) Modified Rankin (Stroke Patients Only) Pre-Morbid Rankin Score: No significant disability Modified Rankin: Moderate disability     Balance Overall balance assessment: Needs assistance   Sitting balance-Leahy Scale: Normal       Standing balance-Leahy Scale: Good                               Pertinent Vitals/Pain Pain Assessment Pain Assessment: No/denies pain    Home Living Family/patient expects to be discharged to:: Private residence Living Arrangements: Alone   Type of Home: House Home Access: Stairs to enter Entrance Stairs-Rails: Right Entrance Stairs-Number of Steps: 4   Home Layout: One level Home Equipment: Shower seat - built in;Grab bars - tub/shower;Hand held shower head      Prior Function Prior Level of Function : Independent/Modified Independent             Mobility Comments: has had episodes of stumbling or LOB, but no falls ADLs Comments: drives, cooks, cleans, grocery shops, takes care of sister     Hand Dominance   Dominant Hand: Right    Extremity/Trunk Assessment   Upper Extremity Assessment Upper Extremity Assessment: Overall WFL for tasks assessed    Lower Extremity  Assessment Lower Extremity Assessment: Overall WFL for tasks assessed       Communication   Communication: No difficulties  Cognition Arousal/Alertness: Awake/alert Behavior During Therapy: WFL for tasks assessed/performed Overall Cognitive Status: Within Functional Limits for tasks assessed                                          General Comments General comments (skin integrity, edema, etc.): treated for L BPPV  with Eply maneuver x 1    Exercises     Assessment/Plan    PT Assessment All further PT needs can be met in the next venue of care  PT Problem List         PT Treatment Interventions      PT Goals (Current goals can be found in the Care Plan section)  Acute Rehab PT Goals Patient Stated Goal: agrees to outpatient vestibular rehab PT Goal Formulation: All assessment and education complete, DC therapy    Frequency       Co-evaluation               AM-PAC PT "6 Clicks" Mobility  Outcome Measure Help needed turning from your back to your side while in a flat bed without using bedrails?: None Help needed moving from lying on your back to sitting on the side of a flat bed without using bedrails?: None Help needed moving to and from a bed to a chair (including a wheelchair)?: None Help needed standing up from a chair using your arms (e.g., wheelchair or bedside chair)?: None Help needed to walk in hospital room?: None Help needed climbing 3-5 steps with a railing? : A Little 6 Click Score: 23    End of Session   Activity Tolerance: Patient tolerated treatment well Patient left: in bed;with call bell/phone within reach   PT Visit Diagnosis: Dizziness and giddiness (R42);BPPV BPPV - Right/Left : Left    Time: 3672-5500 PT Time Calculation (min) (ACUTE ONLY): 42 min   Charges:   PT Evaluation $PT Eval Moderate Complexity: 1 Mod PT Treatments $Neuromuscular Re-education: 8-22 mins $Canalith Rep Proc: 8-22 mins        Magda Kiel, PT Acute Rehabilitation Services Pager:[phone] Office:[phone] 03/07/2021   Reginia Naas 03/07/2021, 2:48 PM

## 2021-03-07 NOTE — Discharge Instructions (Signed)
Post procedure wound care instructions (heart monitor site) Keep incision clean and dry for 3 days. You can remove outer dressing tomorrow. Leave steri-strips (little pieces of tape) on until seen in the office for wound check appointment. Call the office ([phone]) for redness, drainage, swelling, or fever.  

## 2021-03-10 ENCOUNTER — Encounter (HOSPITAL_COMMUNITY): Payer: Self-pay | Admitting: Cardiology

## 2021-03-12 ENCOUNTER — Telehealth: Payer: Self-pay | Admitting: Psychiatry

## 2021-03-12 NOTE — Telephone Encounter (Signed)
error 

## 2021-03-14 ENCOUNTER — Ambulatory Visit: Payer: Medicare (Managed Care) | Attending: Psychiatry | Admitting: Rehabilitative and Restorative Service Providers"

## 2021-03-14 ENCOUNTER — Encounter: Payer: Self-pay | Admitting: Rehabilitative and Restorative Service Providers"

## 2021-03-14 ENCOUNTER — Other Ambulatory Visit: Payer: Self-pay

## 2021-03-14 DIAGNOSIS — R2689 Other abnormalities of gait and mobility: Secondary | ICD-10-CM | POA: Insufficient documentation

## 2021-03-14 DIAGNOSIS — R42 Dizziness and giddiness: Secondary | ICD-10-CM | POA: Insufficient documentation

## 2021-03-14 NOTE — Patient Instructions (Signed)
Access Code: [email or web address] URL: [email or web address] Date: 03/14/2021 Prepared by: Margretta Ditty  Exercises Rolling right<>left sides for vestibular habituation - 1-2 x daily - 7 x weekly - 1 sets - 3-5 reps Seated Gaze Stabilization with Head Rotation - 2 x daily - 7 x weekly - 1 sets - 1 reps - 30 seconds hold

## 2021-03-14 NOTE — Therapy (Signed)
[facility] [address] [place], [place], [address] Phone: [phone]   Fax:  [phone]  Physical Therapy Evaluation  Patient Details  Name: CORRA BENCH MRN: [id number] Date of Birth: [birth year] Referring Provider (PT): Genia Harold, MD   Encounter Date: 03/14/2021   PT End of Session - 03/14/21 0841     Visit Number 1    Number of Visits 4    Date for PT Re-Evaluation 04/13/21    Authorization Type cigna    PT Start Time 0802    PT Stop Time 0844    PT Time Calculation (min) 42 min    Activity Tolerance Patient tolerated treatment well    Behavior During Therapy [facility] for tasks assessed/performed             Past Medical History:  Diagnosis Date   Dizziness    Dizziness and giddiness    Hyperlipidemia    Migraine    Sarcoidosis    Stress incontinence     Past Surgical History:  Procedure Laterality Date   BLADDER SUSPENSION N/A 12/07/2019   Procedure: TRANSVAGINAL TAPE (TVT) PROCEDURE;  Surgeon: Everett Graff, MD;  Location: [place];  Service: Gynecology;  Laterality: N/A;   CYSTOSCOPY N/A 12/07/2019   Procedure: CYSTOSCOPY;  Surgeon: Everett Graff, MD;  Location: [place];  Service: Gynecology;  Laterality: N/A;   LOOP RECORDER INSERTION N/A 03/07/2021   Procedure: LOOP RECORDER INSERTION;  Surgeon: Vickie Epley, MD;  Location: [place];  Service: Cardiovascular;  Laterality: N/A;   NO PAST SURGERIES      There were no vitals filed for this visit.    Subjective Assessment - 03/14/21 0804     Subjective The patient reports that she has had vertigo off and on since 2007.  She describes episodes of vertigo in which she fell.  "Now I know it is coming and I can work with it."  She reports she no longer lays flat and has to prop on a wedge pillow.  She is only able to sleep a few hours because of vertigo.  She describes these episodes as n/v, spinning, and imbalance.  She went to the ED 03/06/21  b/c of dizziness and an MRI showed acute CVA.  She can lay on her right side and avoids the left side.    Pertinent History h/o migraines as a child, h/o motion sickness, recent MRI revealed CVA posterior circulation    Patient Stated Goals "I don't want to be dizzy all of the time." She notes it hinders her.  Improve sleep.    Currently in Pain? No/denies                [facility] PT Assessment - 03/14/21 0813       Assessment   Medical Diagnosis vertigo (referral from Dr. Billey Gosling 01/2021); CVA referred from hospital (03/07/21)    Referring Provider (PT) Genia Harold, MD    Onset Date/Surgical Date 02/05/21    Hand Dominance Right    Prior Therapy none      Precautions   Precautions Fall      Restrictions   Weight Bearing Restrictions No      Balance Screen   Has the patient fallen in the past 6 months No    Has the patient had a decrease in activity level because of a fear of falling?  No    Is the patient reluctant to leave their home because of  a fear of falling?  No      Home Environment   Living Environment Private residence    [place] to enter    Entrance Stairs-Number of Steps [place] One level      Prior Function   Level of Independence Independent      Observation/Other Assessments   Focus on Therapeutic Outcomes (FOTO)  n/a--not available      Sensation   Light Touch Appears Intact   has h/o some toe numbness                   Vestibular Assessment - 03/14/21 0816       Vestibular Assessment   General Observation Patient ambulates into clinic independently without device      Symptom Behavior   Subjective history of current problem Patient has 10+ year h/o vertigo with positional changes.  No vertigo at rest.    Type of Dizziness  Spinning;Unsteady with head/body turns   n/v   Frequency of Dizziness intermittent    Duration of Dizziness minutes    Symptom Nature  Intermittent;Variable;Spontaneous;Positional    Aggravating Factors Rolling to left;Spontaneous onset;Lying supine    Relieving Factors Slow movements;Comments   lays down with head elevated     Oculomotor Exam   Oculomotor Alignment Normal    Ocular ROM WFLs    Spontaneous Absent    Gaze-induced  Absent    Smooth Pursuits Intact    Saccades Overshoots   moving to midline from R gaze   Comment Wears contact L near for reading, R far vision.  ON the computer, she gets blurriness and has difficulty reading on the computer.      Vestibulo-Ocular Reflex   VOR 1 Head Only (x 1 viewing) self regulated pace VOR 0/10 dizziness    VOR Cancellation Normal   notes 1/10 dizziness     Positional Testing   Dix-Hallpike Dix-Hallpike Right;Dix-Hallpike Left    Horizontal Canal Testing Horizontal Canal Right;Horizontal Canal Left      Dix-Hallpike Right   Dix-Hallpike Right Duration 20 seconds of symptoms-- not spinning, but head doesn't feel "right".  Rates dizziness 4/10    Dix-Hallpike Right Symptoms No nystagmus      Dix-Hallpike Left   Dix-Hallpike Left Duration none    Dix-Hallpike Left Symptoms No nystagmus      Sidelying Right   Sidelying Right Duration none    Sidelying Right Symptoms No nystagmus      Sidelying Left   Sidelying Left Duration none    Sidelying Left Symptoms No nystagmus      Horizontal Canal Right   Horizontal Canal Right Duration none    Horizontal Canal Right Symptoms Normal      Horizontal Canal Left   Horizontal Canal Left Duration none    Horizontal Canal Left Symptoms Normal      Positional Sensitivities   Sit to Supine Lightheadedness    Supine to Left Side --   head pressure; if I was at home I would sit up   Supine to Right Side No dizziness    Supine to Sitting Mild dizziness    Right Hallpike Mild dizziness    Up from Right Hallpike Mild dizziness    Up from Left Hallpike No dizziness    Head Turning x 5 Lightheadedness    Head Nodding x 5 No  dizziness    Rolling Right No dizziness  Rolling Left No dizziness                Objective measurements completed on examination: See above findings.        Vestibular Treatment/Exercise - 03/14/21 [id number]       Vestibular Treatment/Exercise   Vestibular Treatment Provided Habituation;Gaze    Habituation Exercises Horizontal Roll    Gaze Exercises X1 Viewing Horizontal      Horizontal Roll   Symptom Description  educated and discussed goals for HEP      X1 Viewing Horizontal   Foot Position seated    Comments 30 seconds with cues on speed and gaze                    PT Education - 03/14/21 0840     Education Details HEP    Person(s) Educated Patient    Methods Explanation;Demonstration;Handout    Comprehension Verbalized understanding;Returned demonstration                 PT Long Term Goals - 03/14/21 0842       PT LONG TERM GOAL #1   Title The patient will be indep with HEP for habituation, gaze, and self mgmt of positional symtpoms.    Time 4    Period Weeks    Target Date 04/13/21      PT LONG TERM GOAL #2   Title The patient will verbalize ability to lay flat to sleep.    Time 4    Period Weeks    Target Date 04/13/21      PT LONG TERM GOAL #3   Title The patient will tolerate rolling in bed and sit<>sidelying without dizziness.    Time 4    Period Weeks    Target Date 04/13/21                    Plan - 03/14/21 1236     Clinical Impression Statement The patient is a 66 yo female presenting to OP physical therapy with h/o intermittent vertigo and recent CVA.  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  She has recent worsening of vision with blurriness.  PT to progress treatment to patinet tolerance.  Vertigo appears better today than it has been-- will see 1x/week and modify plan based on  clinical presentaiton.    Personal Factors and Comorbidities Comorbidity 2    Comorbidities stroke, migraine    Stability/Clinical Decision Making Stable/Uncomplicated    Clinical Decision Making Low    Rehab Potential Good    PT Frequency 1x / week    PT Duration 4 weeks    PT Treatment/Interventions ADLs/Self Care Home Management;Patient/family education;Canalith Repostioning;Vestibular;Gait training;Therapeutic activities;Therapeutic exercise;Neuromuscular re-education    PT Next Visit Plan check HEP, reassess motion sensitivity and update iwth habituation program    PT Home Exercise Plan [phone]    Consulted and Agree with Plan of Care Patient             Patient will benefit from skilled therapeutic intervention in order to improve the following deficits and impairments:  Dizziness, Decreased balance, Decreased activity tolerance  Visit Diagnosis: Dizziness and giddiness  Other abnormalities of gait and mobility     Problem List Patient Active Problem List   Diagnosis Date Noted   CVA (cerebral vascular accident) ([place]) 03/06/2021   Dizziness 03/06/2021   Female stress incontinence  11/23/2019   Atrophic vaginitis 09/01/2011   Herpesvirus 2 09/01/2011   Vulvar lesion 08/11/2011   SARCOIDOSIS, PULMONARY 12/03/2006   ALLERGIC RHINITIS 12/03/2006    Alyss Granato, PT 03/14/2021, 12:40 PM  [facility] [address] [place], [place], [address] Phone: [phone]   Fax:  [phone]  Name: BENNYE CILIBERTI MRN: [id number] Date of Birth: 27-Sep-1955

## 2021-03-19 ENCOUNTER — Other Ambulatory Visit: Payer: Self-pay

## 2021-03-19 ENCOUNTER — Ambulatory Visit (INDEPENDENT_AMBULATORY_CARE_PROVIDER_SITE_OTHER): Payer: Medicare (Managed Care)

## 2021-03-19 DIAGNOSIS — I639 Cerebral infarction, unspecified: Secondary | ICD-10-CM

## 2021-03-19 LAB — CUP PACEART INCLINIC DEVICE CHECK
Date Time Interrogation Session: 20230201084213
Implantable Pulse Generator Implant Date: 20230120

## 2021-03-19 NOTE — Progress Notes (Signed)
ILR wound check in clinic. Steri strips removed. Wound well healed. Home monitor transmitting nightly. No episodes. Wound care discussed with verbal understanding. Questions answered.

## 2021-03-19 NOTE — Patient Instructions (Addendum)
You may now shower and wash your incision with warm soap water. Please call if you have any bleeding, redness, swelling, drainage, fever or chills.  [facility] [phone].

## 2021-03-21 ENCOUNTER — Ambulatory Visit: Payer: Medicare (Managed Care) | Admitting: Rehabilitative and Restorative Service Providers"

## 2021-03-24 ENCOUNTER — Emergency Department (HOSPITAL_COMMUNITY): Payer: Medicare (Managed Care)

## 2021-03-24 ENCOUNTER — Emergency Department (HOSPITAL_COMMUNITY)
Admission: EM | Admit: 2021-03-24 | Discharge: 2021-03-24 | Disposition: A | Discharge status: Home / Self Care | Payer: Medicare (Managed Care) | Attending: Emergency Medicine | Admitting: Emergency Medicine

## 2021-03-24 ENCOUNTER — Encounter (HOSPITAL_COMMUNITY): Payer: Self-pay

## 2021-03-24 DIAGNOSIS — Z7982 Long term (current) use of aspirin: Secondary | ICD-10-CM | POA: Insufficient documentation

## 2021-03-24 DIAGNOSIS — R079 Chest pain, unspecified: Secondary | ICD-10-CM | POA: Insufficient documentation

## 2021-03-24 LAB — CBC
HCT: 40.6 % (ref 36.0–46.0)
Hemoglobin: 13.4 g/dL (ref 12.0–15.0)
MCH: 28.9 pg (ref 26.0–34.0)
MCHC: 33 g/dL (ref 30.0–36.0)
MCV: 87.5 fL (ref 80.0–100.0)
Platelets: 321 10*3/uL (ref 150–400)
RBC: 4.64 MIL/uL (ref 3.87–5.11)
RDW: 11.4 % — ABNORMAL LOW (ref 11.5–15.5)
WBC: 4.8 10*3/uL (ref 4.0–10.5)
nRBC: 0 % (ref 0.0–0.2)

## 2021-03-24 LAB — TROPONIN I (HIGH SENSITIVITY)
Troponin I (High Sensitivity): 5 ng/L (ref <18)
Troponin I (High Sensitivity): 3 ng/L (ref <18)

## 2021-03-24 LAB — BASIC METABOLIC PANEL
Anion gap: 11 (ref 5–15)
BUN: 11 mg/dL (ref 8–23)
CO2: 25 mmol/L (ref 22–32)
Calcium: 9 mg/dL (ref 8.9–10.3)
Chloride: 106 mmol/L (ref 98–111)
Creatinine, Ser: 0.82 mg/dL (ref 0.44–1.00)
GFR, Estimated: >60 mL/min (ref >60)
Glucose, Bld: 98 mg/dL (ref 70–99)
Potassium: 3.9 mmol/L (ref 3.5–5.1)
Sodium: 142 mmol/L (ref 135–145)

## 2021-03-24 NOTE — ED Provider Notes (Signed)
[facility] EMERGENCY DEPARTMENT Provider Note   CSN: [id number] Arrival date & time: 03/24/21  1828     History  Chief Complaint  Patient presents with   Chest Pain    Kristy [name] is a 66 y.o. female.   Chest Pain Associated symptoms: no fever    HPI: A 66 year old patient with a history of CVA and hypercholesterolemia presents for evaluation of chest pain. Initial onset of pain was approximately 1-3 hours ago. The patient's chest pain is described as heaviness/pressure/tightness and is not worse with exertion. The patient's chest pain is middle- or left-sided, is not well-localized, is not sharp and does not radiate to the arms/jaw/neck. The patient does not complain of nausea and denies diaphoresis. The patient has no history of peripheral artery disease, has not smoked in the past 90 days, denies any history of treated diabetes, has no relevant family history of coronary artery disease (first degree relative at less than age 66), is not hypertensive and does not have an elevated BMI (>=30).   Home Medications Prior to Admission medications   Medication Sig Start Date End Date Taking? Authorizing Provider  Ascorbic Acid (VITA-C PO) Take 1,000 mg by mouth daily.   Yes [provider]  aspirin EC 81 MG tablet Take 1 tablet (81 mg total) by mouth daily. Swallow whole. 03/07/21  Yes Tyrone Nine, MD  Aspirin Effervescent (ALKA-SELTZER ORIGINAL PO) Take 2 tablets by mouth daily as needed (allergies).   Yes [provider]  atorvastatin (LIPITOR) 80 MG tablet Take 1 tablet (80 mg total) by mouth daily. 03/07/21  Yes Tyrone Nine, MD  B Complex-C (SUPER B COMPLEX PO) Take 1 tablet by mouth daily.   Yes [provider]  cetirizine (ZYRTEC) 10 MG tablet Take 10 mg by mouth daily as needed for allergies.   Yes [provider]  cholecalciferol (VITAMIN D3) 25 MCG (1000 UNIT) tablet Take 1,000 Units by mouth daily.   Yes [provider]  clopidogrel (PLAVIX) 75 MG tablet Take 1 tablet (75 mg total) by mouth daily. 03/08/21  Yes Tyrone Nine, MD  Docusate Sodium (STOOL SOFTENER LAXATIVE PO) Take 200 mg by mouth daily.   Yes [provider]  fluticasone (FLONASE) 50 MCG/ACT nasal spray Place 1 spray into both nostrils daily as needed for allergies.   Yes [provider]  HYDROcodone-acetaminophen (NORCO/VICODIN) 5-325 MG tablet Take 1 tablet by mouth every 6 (six) hours as needed for moderate pain or severe pain. 12/08/19  Yes Osborn Coho, MD  Magnesium Oxide (MAG-OX PO) Take 400 mg by mouth 2 (two) times daily.   Yes [provider]  meclizine (ANTIVERT) 25 MG tablet Take 25 mg by mouth 3 (three) times daily as needed for dizziness.   Yes [provider]  meloxicam (MOBIC) 15 MG tablet Take 15 mg by mouth daily.   Yes [provider]  mirabegron ER (MYRBETRIQ) 25 MG TB24 tablet Take 25 mg by mouth daily.   Yes [provider]  naproxen sodium (ALEVE) 220 MG tablet Take 660 mg by mouth daily as needed (back pain).   Yes [provider]  Omega-3 Fatty Acids (FISH OIL) 1000 MG CAPS Take 1,000 mg by mouth daily.   Yes [provider]  ondansetron (ZOFRAN) 8 MG tablet Take 1 tablet (8 mg total) by mouth every 8 (eight) hours as needed for nausea or vomiting. 02/05/21  Yes Ocie Doyne, MD  oxymetazoline (AFRIN) 0.05 %  nasal spray Place 1 spray into both nostrils daily as needed for congestion.   Yes [provider]  phentermine 37.5 MG capsule Take 37.5 mg by mouth every morning.   Yes [provider]  PRESCRIPTION MEDICATION Inject 1 Dose as directed every Monday. Lipotropic injection   Yes [provider]  Probiotic Product (PROBIOTIC GUMMIES PO) Take 1 tablet by mouth daily. 500 million   Yes [provider]  UNABLE TO FIND Take 1 tablet by mouth daily. Med Name: Fiber well gummy's   Yes [provider]  valACYclovir (VALTREX) 500 MG tablet Take 500 mg by mouth daily as needed (fever blisters).   Yes [provider]  OVER THE COUNTER MEDICATION Apply 1 application topically daily as needed (pain). [place] balm    [provider]      Allergies    Patient has no known allergies.    Review of Systems   Review of Systems  Constitutional:  Negative for fever.  Cardiovascular:  Positive for chest pain.   Physical Exam Updated Vital Signs BP 125/80    Pulse 83    Temp 98.6 F (37 C) (Oral)    Resp 19    SpO2 94%  Physical Exam Vitals and nursing note reviewed.  Constitutional:      General: She is not in acute distress.    Appearance: She is well-developed.  HENT:     Head: Normocephalic and atraumatic.     Right Ear: External ear normal.     Left Ear: External ear normal.  Eyes:     General: No scleral icterus.       Right eye: No discharge.        Left eye: No discharge.     Conjunctiva/sclera: Conjunctivae normal.  Neck:     Trachea: No tracheal deviation.  Cardiovascular:     Rate and Rhythm: Normal rate and regular rhythm.  Pulmonary:     Effort: Pulmonary effort is normal. No respiratory distress.     Breath sounds: Normal breath sounds. No stridor. No wheezing or rales.  Abdominal:     General: Bowel sounds are normal. There is no distension.     Palpations: Abdomen is soft.     Tenderness: There is no abdominal tenderness. There is no guarding or rebound.  Musculoskeletal:        General: No tenderness or deformity.     Cervical back: Neck supple.  Skin:    General: Skin is warm and dry.     Findings: No rash.  Neurological:     General: No focal deficit present.     Mental Status: She is alert.     Cranial Nerves: No cranial nerve deficit (no facial droop, extraocular movements intact, no slurred speech).     Sensory: No sensory deficit.     Motor: No abnormal muscle tone or seizure activity.     Coordination: Coordination  normal.  Psychiatric:        Mood and Affect: Mood normal.    ED Results / Procedures / Treatments   Labs (all labs ordered are listed, but only abnormal results are displayed) Labs Reviewed  CBC - Abnormal; Notable for the following components:      Result Value   RDW 11.4 (*)    All other components within normal limits  BASIC METABOLIC PANEL  TROPONIN I (HIGH SENSITIVITY)  TROPONIN I (HIGH SENSITIVITY)    EKG EKG Interpretation  Date/Time:  Monday March 24 2021 21:39:08 EST Ventricular Rate:  79 PR Interval:  172 QRS Duration: 138 QT Interval:  424 QTC Calculation: 487 R Axis:   100 Text Interpretation: Sinus rhythm RBBB and LPFB No significant change since last tracing Confirmed by Linwood Dibbles [id number]) on 03/24/2021 9:40:27 PM  Radiology DG Chest 2 View  Result Date: 03/24/2021 CLINICAL DATA:  Chest pain EXAM: CHEST - 2 VIEW COMPARISON:  05/16/2010 FINDINGS: Lungs are well expanded, symmetric, and clear save for minimal lingular atelectasis. No pneumothorax or pleural effusion. Cardiac size within normal limits. Interval placement of an implanted loop recorder. Pulmonary vascularity is normal. Osseous structures are age-appropriate. No acute bone abnormality. IMPRESSION: No active cardiopulmonary disease. Electronically Signed   By: Helyn Numbers M.D.   On: 03/24/2021 19:53    Procedures Procedures    Medications Ordered in ED Medications - No data to display  ED Course/ Medical Decision Making/ A&P Clinical Course as of 03/24/21 2337  Mon Mar 24, 2021  2050 CBC(!) CBC is normal [JK]  2050 DG Chest 2 View Chest x-ray images and radiology report reviewed.  No acute findings [JK]  2323 Serial troponins normal [JK]    Clinical Course User Index [JK] Linwood Dibbles, MD   HEAR Score: 6                       Medical Decision Making Amount and/or Complexity of Data Reviewed Labs: ordered. Decision-making details documented in ED Course. Radiology: ordered.  Decision-making details documented in ED Course.   Patient presented to the ER for evaluation of chest pain.  Moderate risk heart score.  Patient has a moderate risk heart score of 6.  Serial troponins however are normal.  Overall doubt acute coronary syndrome  Patient is not having any shortness of breath.  No tachycardia or tachypnea.  I doubt pulmonary embolism.  Etiology of her chest discomfort is unclear but ED work-up overall reassuring.  Discussed close outpatient follow-up and possible stress testing  Pt had a recent stroke.  No new neurologic sx today   Evaluation and diagnostic testing in the emergency department does not suggest an emergent condition requiring admission or immediate intervention beyond what has been performed at this time.  The patient is safe for discharge and has been instructed to return immediately for worsening symptoms, change in symptoms or any other concerns.         Final Clinical Impression(s) / ED Diagnoses Final diagnoses:  Chest pain, unspecified type    Rx / DC Orders ED Discharge Orders     None         Linwood Dibbles, MD 03/24/21 2337

## 2021-03-24 NOTE — Discharge Instructions (Signed)
Continue your current medications.  Follow-up with your primary care doctor.  Discuss possible outpatient stress testing.  Return as needed for worsening symptoms

## 2021-03-24 NOTE — ED Triage Notes (Signed)
Pt c/o centralized chest tightness/intermittent pain x3 days. Denies N/V, endorses associated SHOB. Hx stroke 2wks ago, blurred vision & "not thinking quite right." Pt on plavix

## 2021-03-26 ENCOUNTER — Encounter: Payer: Self-pay | Admitting: Rehabilitative and Restorative Service Providers"

## 2021-03-26 ENCOUNTER — Ambulatory Visit: Payer: Medicare (Managed Care) | Attending: Psychiatry | Admitting: Rehabilitative and Restorative Service Providers"

## 2021-03-26 ENCOUNTER — Other Ambulatory Visit: Payer: Self-pay

## 2021-03-26 DIAGNOSIS — R2689 Other abnormalities of gait and mobility: Secondary | ICD-10-CM | POA: Diagnosis present

## 2021-03-26 DIAGNOSIS — R42 Dizziness and giddiness: Secondary | ICD-10-CM | POA: Diagnosis not present

## 2021-03-26 NOTE — Patient Instructions (Signed)
Access Code: [email or web address] URL: [email or web address] Date: 03/26/2021 Prepared by: Margretta Ditty  Program Notes Any dizziness or nausea/motion sickness sensation should settle within 10 minutes of finishing the exercises. You should let symptoms settle in between each exercise and each repetition.    Exercises Rolling right<>left sides for vestibular habituation - 1-2 x daily - 7 x weekly - 1 sets - 3-5 reps Standing Gaze Stabilization with Head Rotation - 2 x daily - 7 x weekly - 1 sets - 2 reps - 30 seconds hold Standing Gaze Stabilization with Head Nod - 2 x daily - 7 x weekly - 1 sets - 2 reps - 30 seconds hold Turning in Corner 360 - 2 x daily - 7 x weekly - 1 sets - 3 reps Walking Tandem Stance - 2 x daily - 7 x weekly - 1 sets - 10 reps

## 2021-03-26 NOTE — Therapy (Signed)
[facility] [address] [place], [place], [address] Phone: [phone]   Fax:  [phone]  Physical Therapy Treatment  Patient Details  Name: Kristy [name] MRN: [id number] Date of Birth: 1955-04-25 Referring Provider (PT): Ocie Doyne, MD   Encounter Date: 03/26/2021   PT End of Session - 03/26/21 0857     Visit Number 2    Number of Visits 4    Date for PT Re-Evaluation 04/13/21    Authorization Type cigna    PT Start Time [phone]    PT Stop Time 0915    PT Time Calculation (min) 23 min    Activity Tolerance Patient tolerated treatment well    Behavior During Therapy [facility] for tasks assessed/performed             Past Medical History:  Diagnosis Date   Dizziness    Dizziness and giddiness    Hyperlipidemia    Migraine    Sarcoidosis    Stress incontinence     Past Surgical History:  Procedure Laterality Date   BLADDER SUSPENSION N/A 12/07/2019   Procedure: TRANSVAGINAL TAPE (TVT) PROCEDURE;  Surgeon: Osborn Coho, MD;  Location: [facility] OR;  Service: Gynecology;  Laterality: N/A;   CYSTOSCOPY N/A 12/07/2019   Procedure: CYSTOSCOPY;  Surgeon: Osborn Coho, MD;  Location: [facility] OR;  Service: Gynecology;  Laterality: N/A;   LOOP RECORDER INSERTION N/A 03/07/2021   Procedure: LOOP RECORDER INSERTION;  Surgeon: Lanier Prude, MD;  Location: [facility] INVASIVE CV LAB;  Service: Cardiovascular;  Laterality: N/A;   NO PAST SURGERIES      There were no vitals filed for this visit.   Subjective Assessment - 03/26/21 0855     Subjective The patient has not had any dizziness since d/c from the hospital.    Pertinent History h/o migraines as a child, h/o motion sickness, recent MRI revealed CVA posterior circulation    Patient Stated Goals "I don't want to be dizzy all of the time." She notes it hinders her.  Improve sleep.    Currently in Pain? No/denies                [facility] PT Assessment - 03/26/21 0901        Assessment   Medical Diagnosis vertigo (referral from Dr. Delena Bali 01/2021); CVA referred from hospital (03/07/21)    Referring Provider (PT) Ocie Doyne, MD                           [facility] Adult PT Treatment/Exercise - 03/26/21 3491       Neuro Re-ed    Neuro Re-ed Details  heel toe walking x 15 feet and single leg stance x 15 seconds each leg             Vestibular Treatment/Exercise - 03/26/21 0902       Vestibular Treatment/Exercise   Vestibular Treatment Provided Habituation;Gaze    Habituation Exercises Horizontal Roll;180 degree Turns;360 degree Turns    Gaze Exercises X1 Viewing Horizontal;X1 Viewing Vertical      Horizontal Roll   Number of Reps  2    Symptom Description  notes a trace of dizziness with rolling that settles quickly and is mild in nature      180 degree Turns   Number of Reps  3    Symptom Description  with visual cues      360 degree Turns   Number of  Reps  3    Symptom Description  increases symptoms to 2/10    COMMENT gave time in between to allow symptoms to return to baseline.      X1 Viewing Horizontal   Foot Position seated and progressed to standing    Comments 30 seconds with cues for gaze stabilty and to slow pace to refixate vision      X1 Viewing Vertical   Foot Position seated progressed to standing    Comments cues for gaze fixation and larger motion                    PT Education - 03/26/21 0916     Education Details HEP    Person(s) Educated Patient    Methods Explanation;Demonstration;Handout    Comprehension Verbalized understanding;Returned demonstration                 PT Long Term Goals - 03/26/21 0923       PT LONG TERM GOAL #1   Title The patient will be indep with HEP for habituation, gaze, and self mgmt of positional symtpoms.    Time 4    Period Weeks    Status On-going    Target Date 04/13/21      PT LONG TERM GOAL #2   Title The patient will verbalize ability to  lay flat to sleep.    Baseline using one pillow now    Time 4    Period Weeks    Status Achieved    Target Date 04/13/21      PT LONG TERM GOAL #3   Title The patient will tolerate rolling in bed and sit<>sidelying without dizziness.    Baseline notes a trace during motion of rolling that settles when movement stops    Time 4    Period Weeks    Status On-going    Target Date 04/13/21                   Plan - 03/26/21 0900     Clinical Impression Statement The patient notes improvement with no episodes of vertigo.  She is still using behavioral modifications avoiding specific movements.  Today's therapy activities did provoke a mile sensation of motion sensitivity.  PT encouraged habituation explaining rationale for home to improve compliance. Pt to work on HEP and progress when we check in 2-3 weeks from now.    Personal Factors and Comorbidities --    Comorbidities stroke, migraine    Stability/Clinical Decision Making Stable/Uncomplicated    Rehab Potential Good    PT Frequency 1x / week    PT Duration 4 weeks    PT Treatment/Interventions ADLs/Self Care Home Management;Patient/family education;Canalith Repostioning;Vestibular;Gait training;Therapeutic activities;Therapeutic exercise;Neuromuscular re-education    PT Next Visit Plan check HEP, reassess motion sensitivity and update iwth habituation program    PT Home Exercise Plan [phone]    Consulted and Agree with Plan of Care Patient             Patient will benefit from skilled therapeutic intervention in order to improve the following deficits and impairments:  Dizziness, Decreased balance, Decreased activity tolerance  Visit Diagnosis: Dizziness and giddiness  Other abnormalities of gait and mobility     Problem List Patient Active Problem List   Diagnosis Date Noted   CVA (cerebral vascular accident) (HCC) 03/06/2021   Dizziness 03/06/2021   Female stress incontinence 11/23/2019   Atrophic  vaginitis 09/01/2011   Herpesvirus 2 09/01/2011  Vulvar lesion 08/11/2011   SARCOIDOSIS, PULMONARY 12/03/2006   ALLERGIC RHINITIS 12/03/2006    Boruch Manuele, PT 03/26/2021, 9:25 AM  [facility] [address] [place], [place], [address] Phone: [phone]   Fax:  [phone]  Name: Kristy [name] MRN: [id number] Date of Birth: [birth year]

## 2021-04-04 ENCOUNTER — Encounter: Payer: Medicare (Managed Care) | Admitting: Physical Therapy

## 2021-04-08 ENCOUNTER — Ambulatory Visit: Payer: Medicare (Managed Care) | Admitting: Neurology

## 2021-04-10 ENCOUNTER — Ambulatory Visit (INDEPENDENT_AMBULATORY_CARE_PROVIDER_SITE_OTHER): Payer: Medicare (Managed Care)

## 2021-04-10 DIAGNOSIS — I639 Cerebral infarction, unspecified: Secondary | ICD-10-CM | POA: Diagnosis not present

## 2021-04-10 LAB — CUP PACEART REMOTE DEVICE CHECK
Date Time Interrogation Session: 20230223053412
Implantable Pulse Generator Implant Date: 20230120

## 2021-04-15 ENCOUNTER — Telehealth: Payer: Self-pay | Admitting: Rehabilitative and Restorative Service Providers"

## 2021-04-15 NOTE — Progress Notes (Signed)
Carelink Summary Report / Loop Recorder 

## 2021-04-15 NOTE — Telephone Encounter (Addendum)
PT returned call to patient-- she had onset of dizziness on 2/25 with lying down and was unable to sleep for 2 days.  She is taking meclizine 2-3 times/day and feels some improvement. We discussed that we would be checking for positional vertigo and reviewing prior home exericse/ treating what we see. She would like to wait until she sees the neurologist later in March before returning.   Leean Amezcua, PT   PHYSICAL THERAPY DISCHARGE SUMMARY  Visits from Start of Care: 2  Current functional level related to goals / functional outcomes: Patient wanted to see neurology before returning.   Remaining deficits: Unknown-- did not return   Education / Equipment: HEP   Patient agrees to discharge. Patient goals were not met. Patient is being discharged due to the patient's request.   Rudell Cobb, PT

## 2021-04-16 ENCOUNTER — Ambulatory Visit: Payer: Medicare (Managed Care) | Admitting: Rehabilitative and Restorative Service Providers"

## 2021-05-08 ENCOUNTER — Encounter: Payer: Self-pay | Admitting: Neurology

## 2021-05-08 ENCOUNTER — Ambulatory Visit (INDEPENDENT_AMBULATORY_CARE_PROVIDER_SITE_OTHER): Payer: Medicare (Managed Care) | Admitting: Neurology

## 2021-05-08 ENCOUNTER — Other Ambulatory Visit: Payer: Self-pay | Admitting: Neurology

## 2021-05-08 VITALS — BP 131/74 | HR 73 | Ht 64.0 in | Wt 173.2 lb

## 2021-05-08 DIAGNOSIS — R42 Dizziness and giddiness: Secondary | ICD-10-CM | POA: Diagnosis not present

## 2021-05-08 DIAGNOSIS — Z800 Family history of malignant neoplasm of digestive organs: Secondary | ICD-10-CM | POA: Insufficient documentation

## 2021-05-08 DIAGNOSIS — G43909 Migraine, unspecified, not intractable, without status migrainosus: Secondary | ICD-10-CM | POA: Insufficient documentation

## 2021-05-08 DIAGNOSIS — I639 Cerebral infarction, unspecified: Secondary | ICD-10-CM

## 2021-05-08 DIAGNOSIS — R748 Abnormal levels of other serum enzymes: Secondary | ICD-10-CM | POA: Insufficient documentation

## 2021-05-08 DIAGNOSIS — E785 Hyperlipidemia, unspecified: Secondary | ICD-10-CM | POA: Insufficient documentation

## 2021-05-08 DIAGNOSIS — I63532 Cerebral infarction due to unspecified occlusion or stenosis of left posterior cerebral artery: Secondary | ICD-10-CM | POA: Diagnosis not present

## 2021-05-08 DIAGNOSIS — B009 Herpesviral infection, unspecified: Secondary | ICD-10-CM | POA: Insufficient documentation

## 2021-05-08 DIAGNOSIS — E669 Obesity, unspecified: Secondary | ICD-10-CM | POA: Insufficient documentation

## 2021-05-08 DIAGNOSIS — Z1211 Encounter for screening for malignant neoplasm of colon: Secondary | ICD-10-CM | POA: Insufficient documentation

## 2021-05-08 MED ORDER — CLOPIDOGREL BISULFATE 75 MG PO TABS: 75.0000 mg | ORAL_TABLET | Freq: Every day | ORAL | 1 refills | Status: DC

## 2021-05-08 MED ORDER — ATORVASTATIN CALCIUM 80 MG PO TABS: 80.0000 mg | ORAL_TABLET | Freq: Every day | ORAL | 1 refills | Status: DC

## 2021-05-08 NOTE — Patient Instructions (Signed)
I had a long d/w patient about his recent  cryptogenic stroke, risk for recurrent stroke/TIAs, personally independently reviewed imaging studies and stroke evaluation results and answered questions.Continue clopidogrel 75 mg daily alone and stop aspirin now for secondary stroke prevention and maintain strict control of hypertension with blood pressure goal below 130/90, diabetes with hemoglobin A1c goal below 6.5% and lipids with LDL cholesterol goal below 70 mg/dL. I also advised the patient to eat a healthy diet with plenty of whole grains, cereals, fruits and vegetables, exercise regularly and maintain ideal body weight .check follow-up lipid profile followup in the future with me in 6 months or call earlier if necessary. ? ?Stroke Prevention ?Some medical conditions and behaviors can lead to a higher chance of having a stroke. You can help prevent a stroke by eating healthy, exercising, not smoking, and managing any medical conditions you have. ?Stroke is a leading cause of functional impairment. Primary prevention is particularly important because a majority of strokes are first-time events. Stroke changes the lives of not only those who experience a stroke but also their family and other caregivers. ?How can this condition affect me? ?A stroke is a medical emergency and should be treated right away. A stroke can lead to brain damage and can sometimes be life-threatening. If a person gets medical treatment right away, there is a better chance of surviving and recovering from a stroke. ?What can increase my risk? ?The following medical conditions may increase your risk of a stroke: ?Cardiovascular disease. ?High blood pressure (hypertension). ?Diabetes. ?High cholesterol. ?Sickle cell disease. ?Blood clotting disorders (hypercoagulable state). ?Obesity. ?Sleep disorders (obstructive sleep apnea). ?Other risk factors include: ?Being older than age 31. ?Having a history of blood clots, stroke, or mini-stroke  (transient ischemic attack, TIA). ?Genetic factors, such as race, ethnicity, or a family history of stroke. ?Smoking cigarettes or using other tobacco products. ?Taking birth control pills, especially if you also use tobacco. ?Heavy use of alcohol or drugs, especially cocaine and methamphetamine. ?Physical inactivity. ?What actions can I take to prevent this? ?Manage your health conditions ?High cholesterol levels. ?Eating a healthy diet is important for preventing high cholesterol. If cholesterol cannot be managed through diet alone, you may need to take medicines. ?Take any prescribed medicines to control your cholesterol as told by your health care provider. ?Hypertension. ?To reduce your risk of stroke, try to keep your blood pressure below 130/80. ?Eating a healthy diet and exercising regularly are important for controlling blood pressure. If these steps are not enough to manage your blood pressure, you may need to take medicines. ?Take any prescribed medicines to control hypertension as told by your health care provider. ?Ask your health care provider if you should monitor your blood pressure at home. ?Have your blood pressure checked every year, even if your blood pressure is normal. Blood pressure increases with age and some medical conditions. ?Diabetes. ?Eating a healthy diet and exercising regularly are important parts of managing your blood sugar (glucose). If your blood sugar cannot be managed through diet and exercise, you may need to take medicines. ?Take any prescribed medicines to control your diabetes as told by your health care provider. ?Get evaluated for obstructive sleep apnea. Talk to your health care provider about getting a sleep evaluation if you snore a lot or have excessive sleepiness. ?Make sure that any other medical conditions you have, such as atrial fibrillation or atherosclerosis, are managed. ?Nutrition ?Follow instructions from your health care provider about what to eat or drink  to help manage your health condition. These instructions may include: ?Reducing your daily calorie intake. ?Limiting how much salt (sodium) you use to 1,500 milligrams (mg) each day. ?Using only healthy fats for cooking, such as olive oil, canola oil, or sunflower oil. ?Eating healthy foods. You can do this by: ?Choosing foods that are high in fiber, such as whole grains, and fresh fruits and vegetables. ?Eating at least 5 servings of fruits and vegetables a day. Try to fill one-half of your plate with fruits and vegetables at each meal. ?Choosing lean protein foods, such as lean cuts of meat, poultry without skin, fish, tofu, beans, and nuts. ?Eating low-fat dairy products. ?Avoiding foods that are high in sodium. This can help lower blood pressure. ?Avoiding foods that have saturated fat, trans fat, and cholesterol. This can help prevent high cholesterol. ?Avoiding processed and prepared foods. ?Counting your daily carbohydrate intake. ? ?Lifestyle ?If you drink alcohol: ?Limit how much you have to: ?0-1 drink a day for women who are not pregnant. ?0-2 drinks a day for men. ?Know how much alcohol is in your drink. In the [place] one drink equals one 12 oz bottle of beer (342mL), one 5 oz glass of wine (146mL), or one 1? oz glass of hard liquor (45mL). ?Do not use any products that contain nicotine or tobacco. These products include cigarettes, chewing tobacco, and vaping devices, such as e-cigarettes. If you need help quitting, ask your health care provider. ?Avoid secondhand smoke. ?Do not use drugs. ?Activity ? ?Try to stay at a healthy weight. ?Get at least 30 minutes of exercise on most days, such as: ?Fast walking. ?Biking. ?Swimming. ?Medicines ?Take over-the-counter and prescription medicines only as told by your health care provider. Aspirin or blood thinners (antiplatelets or anticoagulants) may be recommended to reduce your risk of forming blood clots that can lead to stroke. ?Avoid taking birth control  pills. Talk to your health care provider about the risks of taking birth control pills if: ?You are over 8 years old. ?You smoke. ?You get very bad headaches. ?You have had a blood clot. ?Where to find more information ?American Stroke Association: www.strokeassociation.org ?Get help right away if: ?You or a loved one has any symptoms of a stroke. "BE FAST" is an easy way to remember the main warning signs of a stroke: ?B - Balance. Signs are dizziness, sudden trouble walking, or loss of balance. ?E - Eyes. Signs are trouble seeing or a sudden change in vision. ?F - Face. Signs are sudden weakness or numbness of the face, or the face or eyelid drooping on one side. ?A - Arms. Signs are weakness or numbness in an arm. This happens suddenly and usually on one side of the body. ?S - Speech. Signs are sudden trouble speaking, slurred speech, or trouble understanding what people say. ?T - Time. Time to call emergency services. Write down what time symptoms started. ?You or a loved one has other signs of a stroke, such as: ?A sudden, severe headache with no known cause. ?Nausea or vomiting. ?Seizure. ?These symptoms may represent a serious problem that is an emergency. Do not wait to see if the symptoms will go away. Get medical help right away. Call your local emergency services (911 in the [place] Do not drive yourself to the hospital. ?Summary ?You can help to prevent a stroke by eating healthy, exercising, not smoking, limiting alcohol intake, and managing any medical conditions you may have. ?Do not use any products that contain nicotine or  tobacco. These include cigarettes, chewing tobacco, and vaping devices, such as e-cigarettes. If you need help quitting, ask your health care provider. ?Remember "BE FAST" for warning signs of a stroke. Get help right away if you or a loved one has any of these signs. ?This information is not intended to replace advice given to you by your health care provider. Make sure you  discuss any questions you have with your health care provider. ?Document Revised: 09/04/2019 Document Reviewed: 09/04/2019 ?Elsevier Patient Education ? 2022 [place]. ? ? ?
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
## 2021-05-09 LAB — LIPID PANEL
Chol/HDL Ratio: 1.9 ratio (ref 0.0–4.4)
Cholesterol, Total: 169 mg/dL (ref 100–199)
HDL: 87 mg/dL (ref >39)
LDL Chol Calc (NIH): 73 mg/dL (ref 0–99)
Triglycerides: 44 mg/dL (ref 0–149)
VLDL Cholesterol Cal: 9 mg/dL (ref 5–40)

## 2021-05-13 ENCOUNTER — Ambulatory Visit (INDEPENDENT_AMBULATORY_CARE_PROVIDER_SITE_OTHER): Payer: Medicare (Managed Care)

## 2021-05-13 DIAGNOSIS — I639 Cerebral infarction, unspecified: Secondary | ICD-10-CM

## 2021-05-13 LAB — CUP PACEART REMOTE DEVICE CHECK
Date Time Interrogation Session: 20230328053351
Implantable Pulse Generator Implant Date: 20230120

## 2021-05-19 ENCOUNTER — Inpatient Hospital Stay: Payer: Medicare (Managed Care) | Admitting: Neurology

## 2021-05-26 NOTE — Progress Notes (Signed)
Carelink Summary Report / Loop Recorder 

## 2021-05-27 ENCOUNTER — Other Ambulatory Visit (HOSPITAL_COMMUNITY): Payer: Self-pay | Admitting: Internal Medicine

## 2021-05-27 DIAGNOSIS — E785 Hyperlipidemia, unspecified: Secondary | ICD-10-CM

## 2021-05-30 ENCOUNTER — Telehealth: Payer: Self-pay | Admitting: Neurology

## 2021-05-30 NOTE — Telephone Encounter (Signed)
Pt called stating that her heart monitor has moved downward and she would like to know if it is still ok. Please advise.  ?

## 2021-06-02 ENCOUNTER — Telehealth: Payer: Self-pay

## 2021-06-02 NOTE — Telephone Encounter (Signed)
Pt reports having a loop recorder insertion. She is concerned about the device sliding downwards. She is going to contact cardiology for further instruction. States she has discharge papers from the hospital that should have the correct number to call.  ?

## 2021-06-02 NOTE — Telephone Encounter (Signed)
Patient called in stating her device has moved down in her chest and she wants to know if this is okay. Patient states she has no pain associated with it  ?

## 2021-06-03 NOTE — Telephone Encounter (Signed)
Returned call to Pt. ? ?Advised it was ok her loop had moved.  Monitor is still transmitting. ? ?She states she has a little soreness at insertion site, but no s/s of infection. ? ?She will call if she has any further concerns. ?

## 2021-06-04 ENCOUNTER — Encounter (HOSPITAL_BASED_OUTPATIENT_CLINIC_OR_DEPARTMENT_OTHER): Payer: Self-pay | Admitting: Emergency Medicine

## 2021-06-04 ENCOUNTER — Emergency Department (HOSPITAL_BASED_OUTPATIENT_CLINIC_OR_DEPARTMENT_OTHER)
Admission: EM | Admit: 2021-06-04 | Discharge: 2021-06-04 | Disposition: A | Discharge status: Home / Self Care | Payer: Medicare (Managed Care) | Attending: Emergency Medicine | Admitting: Emergency Medicine

## 2021-06-04 ENCOUNTER — Emergency Department (HOSPITAL_BASED_OUTPATIENT_CLINIC_OR_DEPARTMENT_OTHER): Payer: Medicare (Managed Care)

## 2021-06-04 ENCOUNTER — Other Ambulatory Visit: Payer: Self-pay

## 2021-06-04 ENCOUNTER — Emergency Department (HOSPITAL_COMMUNITY): Payer: Medicare (Managed Care)

## 2021-06-04 DIAGNOSIS — I6789 Other cerebrovascular disease: Secondary | ICD-10-CM | POA: Diagnosis not present

## 2021-06-04 DIAGNOSIS — D869 Sarcoidosis, unspecified: Secondary | ICD-10-CM | POA: Insufficient documentation

## 2021-06-04 DIAGNOSIS — R4701 Aphasia: Secondary | ICD-10-CM | POA: Diagnosis not present

## 2021-06-04 DIAGNOSIS — Z20822 Contact with and (suspected) exposure to covid-19: Secondary | ICD-10-CM | POA: Insufficient documentation

## 2021-06-04 DIAGNOSIS — R42 Dizziness and giddiness: Secondary | ICD-10-CM | POA: Diagnosis not present

## 2021-06-04 DIAGNOSIS — E876 Hypokalemia: Secondary | ICD-10-CM | POA: Diagnosis not present

## 2021-06-04 DIAGNOSIS — R112 Nausea with vomiting, unspecified: Secondary | ICD-10-CM | POA: Insufficient documentation

## 2021-06-04 DIAGNOSIS — R479 Unspecified speech disturbances: Secondary | ICD-10-CM | POA: Insufficient documentation

## 2021-06-04 HISTORY — DX: Dizziness and giddiness: R42

## 2021-06-04 LAB — PROTIME-INR
INR: 1.1 (ref 0.8–1.2)
Prothrombin Time: 14.1 seconds (ref 11.4–15.2)

## 2021-06-04 LAB — TROPONIN I (HIGH SENSITIVITY)
Troponin I (High Sensitivity): 3 ng/L (ref <18)
Troponin I (High Sensitivity): 3 ng/L (ref <18)

## 2021-06-04 LAB — RAPID URINE DRUG SCREEN, HOSP PERFORMED
Amphetamines: NOT DETECTED
Barbiturates: NOT DETECTED
Benzodiazepines: NOT DETECTED
Cocaine: NOT DETECTED
Opiates: NOT DETECTED
Tetrahydrocannabinol: NOT DETECTED

## 2021-06-04 LAB — COMPREHENSIVE METABOLIC PANEL
ALT: 16 U/L (ref 0–44)
AST: 19 U/L (ref 15–41)
Albumin: 4.5 g/dL (ref 3.5–5.0)
Alkaline Phosphatase: 79 U/L (ref 38–126)
Anion gap: 17 — ABNORMAL HIGH (ref 5–15)
BUN: 13 mg/dL (ref 8–23)
CO2: 18 mmol/L — ABNORMAL LOW (ref 22–32)
Calcium: 9.9 mg/dL (ref 8.9–10.3)
Chloride: 104 mmol/L (ref 98–111)
Creatinine, Ser: 0.85 mg/dL (ref 0.44–1.00)
GFR, Estimated: >60 mL/min (ref >60)
Glucose, Bld: 177 mg/dL — ABNORMAL HIGH (ref 70–99)
Potassium: 2.9 mmol/L — ABNORMAL LOW (ref 3.5–5.1)
Sodium: 139 mmol/L (ref 135–145)
Total Bilirubin: 0.9 mg/dL (ref 0.3–1.2)
Total Protein: 7 g/dL (ref 6.5–8.1)

## 2021-06-04 LAB — RESP PANEL BY RT-PCR (FLU A&B, COVID) ARPGX2
Influenza A by PCR: NEGATIVE
Influenza B by PCR: NEGATIVE
SARS Coronavirus 2 by RT PCR: NEGATIVE

## 2021-06-04 LAB — URINALYSIS, ROUTINE W REFLEX MICROSCOPIC
Bilirubin Urine: NEGATIVE
Glucose, UA: NEGATIVE mg/dL
Hgb urine dipstick: NEGATIVE
Ketones, ur: 15 mg/dL — AB
Leukocytes,Ua: NEGATIVE
Nitrite: NEGATIVE
Protein, ur: NEGATIVE mg/dL
Specific Gravity, Urine: 1.007 (ref 1.005–1.030)
pH: 8 (ref 5.0–8.0)

## 2021-06-04 LAB — LIPASE, BLOOD: Lipase: 22 U/L (ref 11–51)

## 2021-06-04 LAB — CBC
HCT: 39.4 % (ref 36.0–46.0)
Hemoglobin: 13.8 g/dL (ref 12.0–15.0)
MCH: 28.9 pg (ref 26.0–34.0)
MCHC: 35 g/dL (ref 30.0–36.0)
MCV: 82.4 fL (ref 80.0–100.0)
Platelets: 294 10*3/uL (ref 150–400)
RBC: 4.78 MIL/uL (ref 3.87–5.11)
RDW: 11 % — ABNORMAL LOW (ref 11.5–15.5)
WBC: 7.2 10*3/uL (ref 4.0–10.5)
nRBC: 0 % (ref 0.0–0.2)

## 2021-06-04 LAB — DIFFERENTIAL
Abs Immature Granulocytes: 0.02 10*3/uL (ref 0.00–0.07)
Basophils Absolute: 0 10*3/uL (ref 0.0–0.1)
Basophils Relative: 0 %
Eosinophils Absolute: 0 10*3/uL (ref 0.0–0.5)
Eosinophils Relative: 0 %
Immature Granulocytes: 0 %
Lymphocytes Relative: 12 %
Lymphs Abs: 0.7 10*3/uL (ref 0.7–4.0)
Monocytes Absolute: 0.4 10*3/uL (ref 0.1–1.0)
Monocytes Relative: 7 %
Neutro Abs: 4.5 10*3/uL (ref 1.7–7.7)
Neutrophils Relative %: 81 %

## 2021-06-04 LAB — ETHANOL: Alcohol, Ethyl (B): 12 mg/dL — ABNORMAL HIGH (ref <10)

## 2021-06-04 LAB — APTT: aPTT: 27 seconds (ref 24–36)

## 2021-06-04 MED ORDER — SCOPOLAMINE 1 MG/3DAYS TD PT72: 1.0000 | MEDICATED_PATCH | TRANSDERMAL | 0 refills | Status: DC

## 2021-06-04 MED ORDER — ONDANSETRON HCL 4 MG/2ML IJ SOLN
4.0000 mg | Freq: Once | INTRAMUSCULAR | Status: AC | PRN
  Administered 2021-06-04: 4 mg via INTRAVENOUS
  Filled 2021-06-04: qty 2

## 2021-06-04 MED ORDER — POTASSIUM CHLORIDE CRYS ER 20 MEQ PO TBCR: 20.0000 meq | EXTENDED_RELEASE_TABLET | Freq: Two times a day (BID) | ORAL | 0 refills | Status: DC

## 2021-06-04 MED ORDER — ONDANSETRON HCL 4 MG/2ML IJ SOLN
4.0000 mg | Freq: Once | INTRAMUSCULAR | Status: AC
  Administered 2021-06-04: 4 mg via INTRAVENOUS
  Filled 2021-06-04: qty 2

## 2021-06-04 MED ORDER — MECLIZINE HCL 25 MG PO TABS
25.0000 mg | ORAL_TABLET | Freq: Once | ORAL | Status: AC
  Administered 2021-06-04: 25 mg via ORAL
  Filled 2021-06-04: qty 1

## 2021-06-04 MED ORDER — SODIUM CHLORIDE 0.9 % IV BOLUS
500.0000 mL | Freq: Once | INTRAVENOUS | Status: AC
  Administered 2021-06-04: 500 mL via INTRAVENOUS

## 2021-06-04 MED ORDER — POTASSIUM CHLORIDE CRYS ER 20 MEQ PO TBCR
40.0000 meq | EXTENDED_RELEASE_TABLET | Freq: Once | ORAL | Status: AC
  Administered 2021-06-04: 40 meq via ORAL
  Filled 2021-06-04: qty 2

## 2021-06-04 NOTE — ED Notes (Signed)
ED Provider at bedside.  While provider speaking with patient a family member walked in and is now also at bedside.   ?

## 2021-06-04 NOTE — ED Provider Notes (Signed)
Patient was sent from [facility] emergency department to [facility] for MRI imaging. ? ?Patient was seen by Dr. Pilar Plate, aagree with history, physical exam and plan.  See their note for further details. Briefly, 66 year old female with a medical history of sarcoidosis and previous CVA presented with a chief complaint of severe worsening of dizziness described as a room spinning sensation as well as accompanying nausea. ? ?Physical Exam  ?BP (!) 141/72 (BP Location: Right Arm)   Pulse 84   Temp (!) 97.3 ?F (36.3 ?C) (Oral)   Resp 14   Wt 75.3 kg   SpO2 100%   BMI 28.49 kg/m?  ? ?Physical Exam ?Vitals and nursing note reviewed.  ?Constitutional:   ?   General: She is not in acute distress. ?   Appearance: She is not ill-appearing, toxic-appearing or diaphoretic.  ?Eyes:  ?   General:     ?   Right eye: No discharge.     ?   Left eye: No discharge.  ?   Extraocular Movements: Extraocular movements intact.  ?   Conjunctiva/sclera: Conjunctivae normal.  ?   Pupils: Pupils are equal, round, and reactive to light.  ?Cardiovascular:  ?   Rate and Rhythm: Normal rate.  ?Pulmonary:  ?   Effort: Pulmonary effort is normal.  ?Musculoskeletal:  ?   Cervical back: Normal range of motion and neck supple. No rigidity.  ?Skin: ?   General: Skin is warm and dry.  ?Neurological:  ?   General: No focal deficit present.  ?   Mental Status: She is alert.  ?   GCS: GCS eye subscore is 4. GCS verbal subscore is 5. GCS motor subscore is 6.  ?   Cranial Nerves: No cranial nerve deficit or facial asymmetry.  ?   Sensory: Sensation is intact.  ?   Motor: No weakness, tremor, seizure activity or pronator drift.  ?   Coordination: Finger-Nose-Finger Test normal.  ?   Comments: CN II-XII intact; performed in supine position, +5 strength to bilateral upper extremities, +5 strength to dorsiflexion and plantarflexion, patient able to lift both legs against gravity and hold each there without difficulty   ?Psychiatric:     ?   Behavior: Behavior  is cooperative.  ? ? ?Procedures  ?Procedures ? ?ED Course / MDM  ?  ?Medical Decision Making ?Amount and/or Complexity of Data Reviewed ?Labs: ordered. ?Radiology: ordered. ? ?Risk ?Prescription drug management. ? ? ?Patient was noted to have hypokalemia with potassium of 2.9.  Patient had repletion with oral potassium. ? ?Dr. Raford Pitcher spoke with Dr. Amada Jupiter of neurology who recommended MRI to exclude new stroke.  If no acute CVA found patient would be a candidate for discharge. ? ?MRI imaging shows no evidence of acute intracranial abnormality.   ? ?We will plan to p.o. challenge patient as well as stand and ambulate her.  If patient has improvement in symptoms we will plan to discharge to follow-up with neurology and PCP in outpatient setting.  Patient will need short course of potassium due to noted hypokalemia. ? ?Patient able to tolerate p.o. intake without nausea or vomiting.  Patient able to stand and ambulate without dizziness.  Patient hemodynamically stable.  Will discharge patient with course of potassium, patient to follow-up with PCP for reevaluation of labs.  Patient to follow-up closely with her neurologist in the outpatient setting.  Patient additionally asking for additional neurology providers for second opinion. ? ?Based on patient's chief complaint, I considered admission  might be necessary, however after reassuring ED workup feel patient is reasonable for discharge.  Discussed results, findings, treatment and follow up. Patient advised of return precautions. Patient verbalized understanding and agreed with plan. ? ?Portions of this note were generated with Scientist, clinical (histocompatibility and immunogenetics). Dictation errors may occur despite best attempts at proofreading. ? ? ? ? ? ?  ?Haskel Schroeder, PA-C ?06/04/21 1004 ? ?  ?Sloan Leiter, DO ?06/04/21 1621 ? ?

## 2021-06-04 NOTE — ED Notes (Signed)
Pt agreeable with plan for xfer to [place] for MRI -- pt however declines [facility] transfer and is insistent that she travel POV with family member driving -- provider, Dr. Pilar Plate, is aware and is agreeable with pt xferring to [facility] via POV -- pt escorted via w/c to exit -- assisted in to backseat with family member driving; no acute changes; vitals stable and time of pt departure.    ?

## 2021-06-04 NOTE — ED Provider Notes (Addendum)
?[facility] ?[facility] Emergency Department ?Provider Note ?MRN:  [id number]  ?Arrival date & time: 06/04/21    ? ?Chief Complaint   ?Nausea ?  ?History of Present Illness   ?Kristy [name] is a 66 y.o. year-old female with a history of sarcoidosis, stroke presenting to the ED with chief complaint of nausea. ? ?Patient endorsing severe worsening of dizziness, described as a room spinning sensation.  Worsening occurred a few hours ago.  Has a long history of vertigo, was diagnosed with a stroke 2 months ago.  Patient's family has noticed worsening speech over the past day or 2. ? ?Review of Systems  ?A thorough review of systems was obtained and all systems are negative except as noted in the HPI and PMH.  ? ?Patient's Health History   ? ?Past Medical History:  ?Diagnosis Date  ? Dizziness   ? Dizziness and giddiness   ? Hyperlipidemia   ? Migraine   ? Sarcoidosis   ? Stress incontinence   ?  ?Past Surgical History:  ?Procedure Laterality Date  ? BLADDER SUSPENSION N/A 12/07/2019  ? Procedure: TRANSVAGINAL TAPE (TVT) PROCEDURE;  Surgeon: Everett Graff, MD;  Location: [place];  Service: Gynecology;  Laterality: N/A;  ? CYSTOSCOPY N/A 12/07/2019  ? Procedure: CYSTOSCOPY;  Surgeon: Everett Graff, MD;  Location: [place];  Service: Gynecology;  Laterality: N/A;  ? LOOP RECORDER INSERTION N/A 03/07/2021  ? Procedure: LOOP RECORDER INSERTION;  Surgeon: Vickie Epley, MD;  Location: [place] [facility];  Service: Cardiovascular;  Laterality: N/A;  ? NO PAST SURGERIES    ?  ?Family History  ?Problem Relation Age of Onset  ? Alzheimer's disease Mother   ? COPD Father   ? Breast cancer Maternal Aunt   ? Breast cancer Paternal Aunt   ?  ?Social History  ? ?Socioeconomic History  ? Marital status: Single  ?  Spouse name: Not on file  ? Number of children: 0  ? Years of education: Not on file  ? Highest education level: Master's degree (e.g., MA, MS, MEng, MEd, MSW, MBA)  ?Occupational History  ?  Comment: [employer]   ?Tobacco Use  ? Smoking status: Never  ? Smokeless tobacco: Never  ?Vaping Use  ? Vaping Use: Never used  ?Substance and Sexual Activity  ? Alcohol use: No  ? Drug use: No  ? Sexual activity: Never  ?  Birth control/protection: None  ?Other Topics Concern  ? Not on file  ?Social History Narrative  ? Right handed  ? Caffeine- 2 soft drinks per day  ? Lives at home alone  ? ?Social Determinants of Health  ? ?Financial Resource Strain: Not on file  ?Food Insecurity: Not on file  ?Transportation Needs: Not on file  ?Physical Activity: Not on file  ?Stress: Not on file  ?Social Connections: Not on file  ?Intimate Partner Violence: Not on file  ?  ? ?Physical Exam  ? ?Vitals:  ? 06/04/21 0326 06/04/21 0330  ?BP:  135/63  ?Pulse: 71 81  ?Resp: 16 (!) 22  ?Temp:    ?SpO2: 100% 100%  ?  ?CONSTITUTIONAL: Chronically ill-appearing, NAD ?NEURO/PSYCH:  Alert and oriented x 3, normal and symmetric strength and sensation, normal coordination, speech is impaired with delay between words ?EYES:  eyes equal and reactive ?ENT/NECK:  no LAD, no JVD ?CARDIO: Regular rate, well-perfused, normal S1 and S2 ?PULM:  CTAB no wheezing or rhonchi ?GI/GU:  non-distended, non-tender ?MSK/SPINE:  No gross deformities, no edema ?SKIN:  no rash, atraumatic ? ? ?*Additional and/or pertinent findings included in MDM below ? ?Diagnostic and Interventional Summary  ? ? EKG Interpretation ? ?Date/Time:  Wednesday June 04 2021 01:12:34 EDT ?Ventricular Rate:  75 ?PR Interval:  190 ?QRS Duration: 148 ?QT Interval:  477 ?QTC Calculation: 533 ?R Axis:   89 ?Text Interpretation: Sinus rhythm Probable left atrial enlargement Right bundle branch block ST depr, consider ischemia, anterolateral lds Confirmed by Gerlene Fee [phone]) on 06/04/2021 1:18:01 AM ?  ? ?  ? ?Labs Reviewed  ?COMPREHENSIVE METABOLIC PANEL - Abnormal; Notable for the following components:  ?    Result Value  ? Potassium 2.9 (*)   ? CO2 18 (*)   ? Glucose, Bld 177 (*)   ? Anion gap 17 (*)    ? All other components within normal limits  ?CBC - Abnormal; Notable for the following components:  ? RDW 11.0 (*)   ? All other components within normal limits  ?URINALYSIS, ROUTINE W REFLEX MICROSCOPIC - Abnormal; Notable for the following components:  ? Color, Urine COLORLESS (*)   ? Ketones, ur 15 (*)   ? All other components within normal limits  ?ETHANOL - Abnormal; Notable for the following components:  ? Alcohol, Ethyl (B) 12 (*)   ? All other components within normal limits  ?RESP PANEL BY RT-PCR (FLU A&B, COVID) ARPGX2  ?LIPASE, BLOOD  ?PROTIME-INR  ?APTT  ?DIFFERENTIAL  ?RAPID URINE DRUG SCREEN, HOSP PERFORMED  ?TROPONIN I (HIGH SENSITIVITY)  ?TROPONIN I (HIGH SENSITIVITY)  ?  ?DG Chest Port 1 View  ?Final Result  ?  ?CT HEAD WO CONTRAST (5MM)  ?Final Result  ?  ?MR BRAIN WO CONTRAST    (Results Pending)  ?  ?Medications  ?sodium chloride 0.9 % bolus 500 mL (0 mLs Intravenous Stopped 06/04/21 0259)  ?ondansetron [facility]) injection 4 mg (4 mg Intravenous Given 06/04/21 0217)  ?  ? ?Procedures  /  Critical Care ?.Critical Care ?Performed by: Maudie Flakes, MD ?Authorized by: Maudie Flakes, MD  ? ?Critical care provider statement:  ?  Critical care time (minutes):  35 ?  Critical care was necessary to treat or prevent imminent or life-threatening deterioration of the following conditions: Concern for acute ischemic stroke. ?  Critical care was time spent personally by me on the following activities:  Development of treatment plan with patient or surrogate, discussions with consultants, evaluation of patient's response to treatment, examination of patient, ordering and review of laboratory studies, ordering and review of radiographic studies, ordering and performing treatments and interventions, pulse oximetry, re-evaluation of patient's condition and review of old charts ? ?ED Course and Medical Decision Making  ?Initial Impression and Ddx ?Concern for worsening stroke or new stroke given the acute  worsening of dizziness associated with speech disturbance.  The speech disturbance is interpreted as a mild aphasia.  Patient's niece believes this started Monday evening, about 30 hours ago and so code stroke initiation is not indicated.  Will obtain Noncon CT head and reach out to neurology for recommendations. ? ?Past medical/surgical history that increases complexity of ED encounter: History of stroke ? ?Interpretation of Diagnostics ?I personally reviewed the EKG and my interpretation is as follows: Nonspecific findings, sinus rhythm, no significant change from prior ?   ?Laboratory evaluation does not reveal any significant blood count or electrolyte disturbance.  CT head is without acute changes. ? ?Patient Reassessment and Ultimate Disposition/Management ?Patient feeling a bit better after Zofran.  Case discussed with  Dr. Leonel Ramsay of neurology, patient will need MRI to exclude new stroke.  If no acute stroke found, would be a candidate for discharge.  Dr. Roxanne Mins is the accepting ED physician at [facility] emergency department.  Patient is refusing ambulance.  Does not want to pay a the bill for an ambulance.  Family is willing to drive her.  We discussed the risks of traveling via private vehicle without healthcare professionals, including worsening of the stroke, worsening nausea vomiting, aspiration, pneumonia, death.  Patient is aware of risks and still wishes to travel via private vehicle. ? ?Addendum 4:40 AM: Patient had a few episodes of low oxygen saturations while sleeping.  She wakes easily with no complaints.  She has no increased work of breathing, she does not complain of chest pain or shortness of breath, upon wakening her oxygen saturation recovers to 100%.  Suspect sleep apnea. ? ?Patient management required discussion with the following services or consulting groups:  Neurology ? ?Complexity of Problems Addressed ?Acute illness or injury that poses threat of life of bodily  function ? ?Additional Data Reviewed and Analyzed ?Further history obtained from: ?Further history from spouse/family member ? ?Additional Factors Impacting ED Encounter Risk ?Consideration of hospitalization ? ?Legrand Como

## 2021-06-04 NOTE — ED Notes (Signed)
Pt assisted to and from hall bathroom via w/c by this nurse-- now back in bed resting with 2 visitors at bedside; no acute distress however pt reports recurrent nausea/dizziness with activity  ?

## 2021-06-04 NOTE — ED Notes (Signed)
Pt from [facility] by POV for MRI. Pt c/o continued nausea. Pt in NAD at time of triage. Pt A&O x 4 ?

## 2021-06-04 NOTE — ED Notes (Signed)
RT to bedside d/t SpO2 reading 76% on monitor. Patient lying in bed with eyes closed, no distress. Patient awakened and encouraged to take deep breaths. SpO2 quickly increased to 96%. ?

## 2021-06-04 NOTE — ED Notes (Signed)
Pt provided water and saltine crackers for PO challenge ? ?Pt also able to walk independently to and from bathroom without feeling dizzy or increased shortness of breath  ?  ?

## 2021-06-04 NOTE — ED Notes (Signed)
ED Provider at bedside after pt O2 sats on RA desat 78% -- pt now on 2L O2 via [place] with improvement to 98% -- pt observed to desat during resting/sleeping state; denies h/o OSA.   ?

## 2021-06-04 NOTE — Discharge Instructions (Addendum)
You came to the emergency department today to be evaluated for your nausea and dizziness.  Your lab results show that your potassium was decreased.  Due to this have given you prescription for potassium.  Please take this medication as prescribed and follow-up with your primary care doctor in the outpatient setting to have this rechecked.  The MRI of your brain did not show any new strokes.  Please follow-up closely with your neurologist for repeat evaluation.   ? ?Get help right away if: ?You vomit or have diarrhea and are unable to eat or drink anything. ?You have problems talking, walking, swallowing, or using your arms, hands, or legs. ?You feel generally weak. ?You have any bleeding. ?You are not thinking clearly or you have trouble forming sentences. It may take a friend or family member to notice this. ?You have chest pain, abdominal pain, shortness of breath, or sweating. ?Your vision changes or you develop a severe headache. ?

## 2021-06-04 NOTE — ED Notes (Signed)
Patient transported to CT 

## 2021-06-04 NOTE — ED Triage Notes (Signed)
Presents from home for N/V, dizziness. H/o same for which she wears a patch. Sx have never been this severe. ? ?4mg  zofran given IV per EMS. ? ?EMS vitals: 140/100, RBBB on monitor ? ?H/o vertigo, CVA 2 mo ago ?

## 2021-06-05 ENCOUNTER — Other Ambulatory Visit: Payer: Self-pay | Admitting: Neurology

## 2021-06-10 ENCOUNTER — Ambulatory Visit (HOSPITAL_COMMUNITY)
Admission: RE | Admit: 2021-06-10 | Discharge: 2021-06-10 | Disposition: A | Discharge status: Home / Self Care | Payer: Self-pay | Source: Ambulatory Visit | Attending: Internal Medicine | Admitting: Internal Medicine

## 2021-06-10 DIAGNOSIS — E785 Hyperlipidemia, unspecified: Secondary | ICD-10-CM | POA: Insufficient documentation

## 2021-06-16 ENCOUNTER — Ambulatory Visit (INDEPENDENT_AMBULATORY_CARE_PROVIDER_SITE_OTHER): Payer: Medicare (Managed Care)

## 2021-06-16 DIAGNOSIS — I639 Cerebral infarction, unspecified: Secondary | ICD-10-CM

## 2021-06-16 LAB — CUP PACEART REMOTE DEVICE CHECK
Date Time Interrogation Session: 20230430231149
Implantable Pulse Generator Implant Date: 20230120

## 2021-07-01 NOTE — Progress Notes (Signed)
Carelink Summary Report / Loop Recorder 

## 2021-07-21 ENCOUNTER — Ambulatory Visit (INDEPENDENT_AMBULATORY_CARE_PROVIDER_SITE_OTHER): Payer: Medicare (Managed Care)

## 2021-07-21 DIAGNOSIS — I639 Cerebral infarction, unspecified: Secondary | ICD-10-CM

## 2021-07-22 LAB — CUP PACEART REMOTE DEVICE CHECK
Date Time Interrogation Session: 20230602230251
Implantable Pulse Generator Implant Date: 20230120

## 2021-08-05 NOTE — Progress Notes (Deleted)
   CC:  vertigo  Follow-up Visit  Last visit: 02/05/21  Brief HPI: 66 year old female with a history of sarcoidosis who follows in clinic for vertigo.  At her last visit brain MRI was ordered. She was referred to vestibular rehab. Zofran was prescribed for nausea.  Interval History: Brain MRI on 03/06/21 showed an acute let occipital infarct. She was sent to the ED for evaluation. CTA did not show a large vessel occlusion or hemodynamically significant stenosis. TTE showed a normal EF. LDL was elevated. Stroke was felt to be cryptogenic so she was placed on a loop recorder.  In terms of her vertigo***Vestibular therapy***   Physical Exam:   Vital Signs: There were no vitals taken for this visit. GENERAL:  well appearing, in no acute distress, alert  SKIN:  Color, texture, turgor normal. No rashes or lesions HEAD:  Normocephalic/atraumatic. RESP: normal respiratory effort MSK:  No gross joint deformities.   NEUROLOGICAL: Mental Status: Alert, oriented to person, place and time, Follows commands, and Speech fluent and appropriate. Cranial Nerves: PERRL, face symmetric, no dysarthria, hearing grossly intact Motor: moves all extremities equally Gait: normal-based.  IMPRESSION: ***  PLAN: ***   Follow-up: ***  I spent a total of *** minutes on the date of the service. Discussed medication side effects, adverse reactions and drug interactions. Written educational materials and patient instructions outlining all of the above were given.  Ocie Doyne, MD

## 2021-08-06 ENCOUNTER — Ambulatory Visit: Payer: Medicare PPO | Admitting: Psychiatry

## 2021-08-06 ENCOUNTER — Telehealth: Payer: Self-pay | Admitting: Psychiatry

## 2021-08-06 NOTE — Telephone Encounter (Signed)
Rescheduled 6/21 appt per pt's request from mychart (see message below).   Appointment Request From: Worthy Keeler  With Provider: Ocie Doyne, MD [[facility]]  Preferred Date Range: Any  Preferred Times: Any Time  Reason for visit: Request an Appointment  Comments: I can not make the appointment on 21st please reschedule

## 2021-08-07 NOTE — Progress Notes (Signed)
Carelink Summary Report / Loop Recorder 

## 2021-08-18 ENCOUNTER — Telehealth: Payer: Self-pay | Admitting: Neurology

## 2021-08-18 NOTE — Telephone Encounter (Signed)
Rescheduled 9/19 appt with pt over the phone- MD out.

## 2021-08-24 LAB — CUP PACEART REMOTE DEVICE CHECK
Date Time Interrogation Session: 20230705231110
Implantable Pulse Generator Implant Date: 20230120

## 2021-08-25 ENCOUNTER — Ambulatory Visit (INDEPENDENT_AMBULATORY_CARE_PROVIDER_SITE_OTHER): Payer: Medicare (Managed Care)

## 2021-08-25 DIAGNOSIS — I639 Cerebral infarction, unspecified: Secondary | ICD-10-CM

## 2021-09-24 NOTE — Progress Notes (Signed)
Carelink Summary Report / Loop Recorder 

## 2021-09-25 LAB — CUP PACEART REMOTE DEVICE CHECK
Date Time Interrogation Session: 20230807231150
Implantable Pulse Generator Implant Date: 20230120

## 2021-11-01 LAB — CUP PACEART REMOTE DEVICE CHECK
Date Time Interrogation Session: 20230909230451
Implantable Pulse Generator Implant Date: 20230120

## 2021-11-03 ENCOUNTER — Ambulatory Visit (INDEPENDENT_AMBULATORY_CARE_PROVIDER_SITE_OTHER): Payer: Medicare (Managed Care)

## 2021-11-03 DIAGNOSIS — I639 Cerebral infarction, unspecified: Secondary | ICD-10-CM

## 2021-11-04 ENCOUNTER — Ambulatory Visit: Payer: Medicare (Managed Care) | Admitting: Neurology

## 2021-11-15 NOTE — Progress Notes (Signed)
Carelink Summary Report / Loop Recorder 

## 2021-11-18 ENCOUNTER — Ambulatory Visit: Payer: Medicare (Managed Care) | Admitting: Neurology

## 2021-11-26 ENCOUNTER — Ambulatory Visit: Payer: Medicare (Managed Care) | Admitting: Psychiatry

## 2021-11-26 ENCOUNTER — Encounter: Payer: Self-pay | Admitting: Psychiatry

## 2021-11-26 NOTE — Progress Notes (Deleted)
   CC:  vertigo  Follow-up Visit  Last visit: 02/05/21  Brief HPI: 66 year old female with a history of sarcoidosis who follows in clinic for vertigo.  At her last visit, brain MRI was ordered and she was referred to vestibular rehab. Zofran was prescribed for nausea.  Interval History: Brain MRI 03/06/21 showed an acute left superior occipital infarct. She was sent to the ED where CTA head/neck showed no significant stenosis. TTE was unremarkable. LDL was elevated. She was placed on a cardiac monitor and no arrhythmias were detected. She is currently taking Plavix 75 mg and lipitor 80 mg daily daily for stroke prevention.  Vertigo***   Physical Exam:   Vital Signs: There were no vitals taken for this visit. GENERAL:  well appearing, in no acute distress, alert  SKIN:  Color, texture, turgor normal. No rashes or lesions HEAD:  Normocephalic/atraumatic. RESP: normal respiratory effort MSK:  No gross joint deformities.   NEUROLOGICAL: Mental Status: Alert, oriented to person, place and time, Follows commands, and Speech fluent and appropriate. Cranial Nerves: PERRL, face symmetric, no dysarthria, hearing grossly intact Motor: moves all extremities equally Gait: normal-based.  IMPRESSION: ***  PLAN: ***   Follow-up: ***  I spent a total of *** minutes on the date of the service. Discussed medication side effects, adverse reactions and drug interactions. Written educational materials and patient instructions outlining all of the above were given.  Genia Harold, MD

## 2021-11-28 ENCOUNTER — Ambulatory Visit (INDEPENDENT_AMBULATORY_CARE_PROVIDER_SITE_OTHER): Payer: Medicare (Managed Care)

## 2021-11-28 DIAGNOSIS — I639 Cerebral infarction, unspecified: Secondary | ICD-10-CM

## 2021-11-28 LAB — CUP PACEART REMOTE DEVICE CHECK
Date Time Interrogation Session: 20231012230158
Implantable Pulse Generator Implant Date: 20230120

## 2021-12-04 NOTE — Progress Notes (Signed)
Carelink Summary Report / Loop Recorder 

## 2021-12-31 ENCOUNTER — Ambulatory Visit (INDEPENDENT_AMBULATORY_CARE_PROVIDER_SITE_OTHER): Payer: Medicare (Managed Care)

## 2021-12-31 DIAGNOSIS — I639 Cerebral infarction, unspecified: Secondary | ICD-10-CM | POA: Diagnosis not present

## 2021-12-31 LAB — CUP PACEART REMOTE DEVICE CHECK
Date Time Interrogation Session: 20231114230419
Implantable Pulse Generator Implant Date: 20230120

## 2022-01-22 NOTE — Progress Notes (Signed)
Carelink Summary Report / Loop Recorder 

## 2022-02-02 ENCOUNTER — Ambulatory Visit (INDEPENDENT_AMBULATORY_CARE_PROVIDER_SITE_OTHER): Payer: Medicare (Managed Care)

## 2022-02-02 DIAGNOSIS — I639 Cerebral infarction, unspecified: Secondary | ICD-10-CM | POA: Diagnosis not present

## 2022-02-04 LAB — CUP PACEART REMOTE DEVICE CHECK
Date Time Interrogation Session: 20231217230715
Implantable Pulse Generator Implant Date: 20230120

## 2022-03-09 ENCOUNTER — Ambulatory Visit: Payer: Medicare (Managed Care) | Attending: Cardiology

## 2022-03-09 DIAGNOSIS — I639 Cerebral infarction, unspecified: Secondary | ICD-10-CM | POA: Diagnosis not present

## 2022-03-10 LAB — CUP PACEART REMOTE DEVICE CHECK
Date Time Interrogation Session: 20240119230534
Implantable Pulse Generator Implant Date: 20230120

## 2022-03-10 NOTE — Progress Notes (Signed)
[facility] Summary Report / Loop Recorder

## 2022-04-13 ENCOUNTER — Ambulatory Visit: Payer: Medicare (Managed Care)

## 2022-04-13 DIAGNOSIS — I639 Cerebral infarction, unspecified: Secondary | ICD-10-CM

## 2022-04-14 LAB — CUP PACEART REMOTE DEVICE CHECK
Date Time Interrogation Session: 20240221231046
Implantable Pulse Generator Implant Date: 20230120

## 2022-04-15 ENCOUNTER — Other Ambulatory Visit: Payer: Self-pay | Admitting: Internal Medicine

## 2022-04-15 ENCOUNTER — Ambulatory Visit
Admission: RE | Admit: 2022-04-15 | Discharge: 2022-04-15 | Disposition: A | Discharge status: Home / Self Care | Payer: Medicare (Managed Care) | Source: Ambulatory Visit | Attending: Internal Medicine | Admitting: Internal Medicine

## 2022-04-15 DIAGNOSIS — M545 Low back pain, unspecified: Secondary | ICD-10-CM

## 2022-04-24 NOTE — Progress Notes (Signed)
Carelink Summary Report / Loop Recorder 

## 2022-05-18 ENCOUNTER — Ambulatory Visit: Payer: Medicare (Managed Care) | Attending: Cardiology

## 2022-05-18 DIAGNOSIS — I639 Cerebral infarction, unspecified: Secondary | ICD-10-CM | POA: Diagnosis not present

## 2022-05-19 LAB — CUP PACEART REMOTE DEVICE CHECK
Date Time Interrogation Session: 20240331230643
Implantable Pulse Generator Implant Date: 20230120

## 2022-05-22 NOTE — Progress Notes (Signed)
Carelink Summary Report / Loop Recorder 

## 2022-06-06 IMAGING — CT CT CARDIAC CORONARY ARTERY CALCIUM SCORE
2 series · 12 of 20 positions shown, 14 images · non-contrast
Comparison: AP chest 06/04/2021; CT chest 07/15/2006 and 03/17/2006

Addendum:
CLINICAL DATA: Cardiovascular Disease Risk stratification

EXAM:
Coronary Calcium Score
TECHNIQUE: A gated, non-contrast computed tomography scan of the heart was
performed using 3mm slice thickness. Axial images were analyzed on a
dedicated workstation. Calcium scoring of the coronary arteries was
performed using the Agatston method.

[Series 3: cascseq 2.0 b35f 70% · axial · 0.39mm/px · z∈[+1140,+1186]mm · 4 of 69 slices shown]
[im 8/69  vessel]
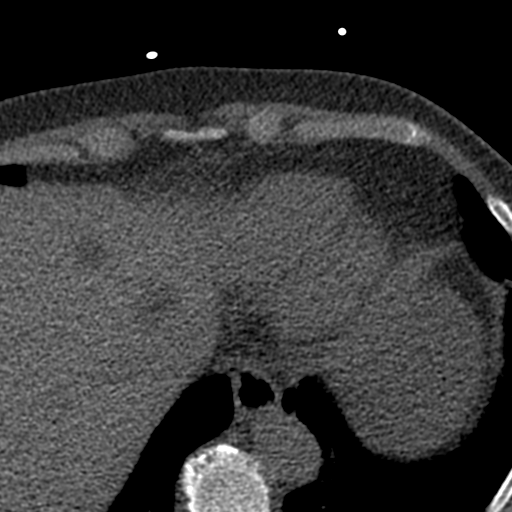
[im 16/69  vessel]
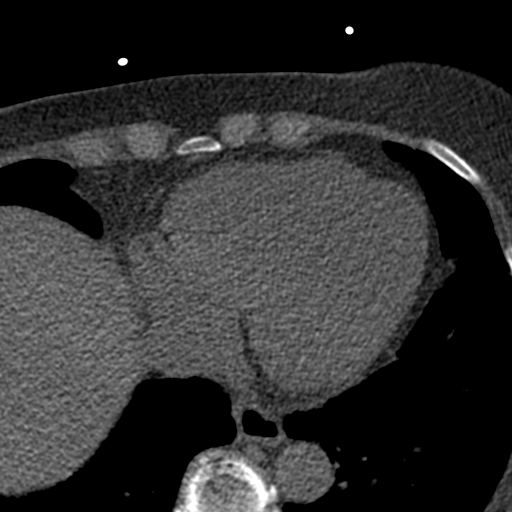
[im 23/69  vessel]
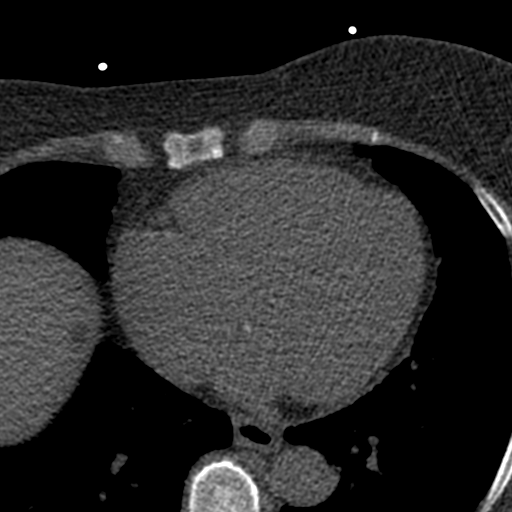
[im 31/69  vessel]
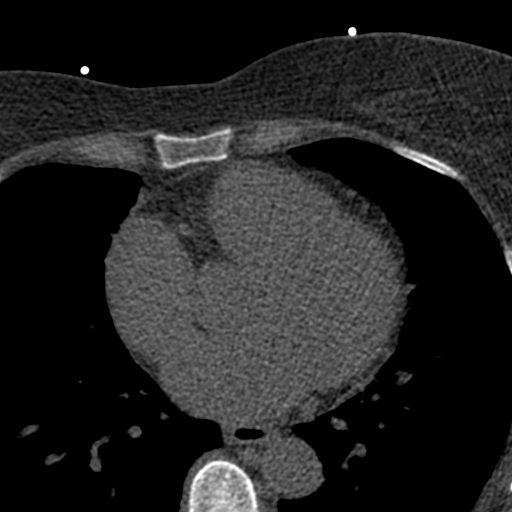

[Series 4: ax st full fov · axial · 0.77mm/px · z∈[+1140,+1246]mm · 8 of 69 slices shown, 10 images]
[im 8/69  vessel]
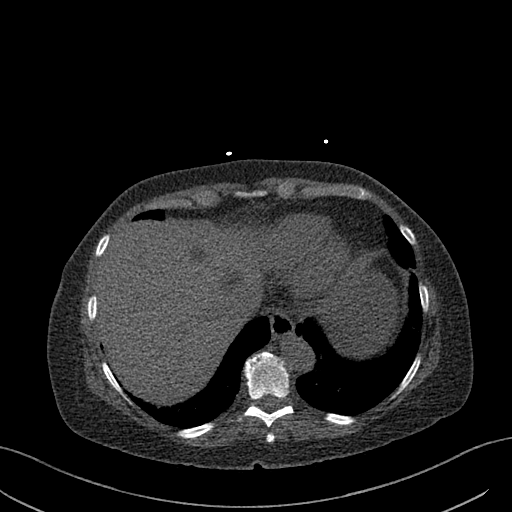
[im 8/69  lung]
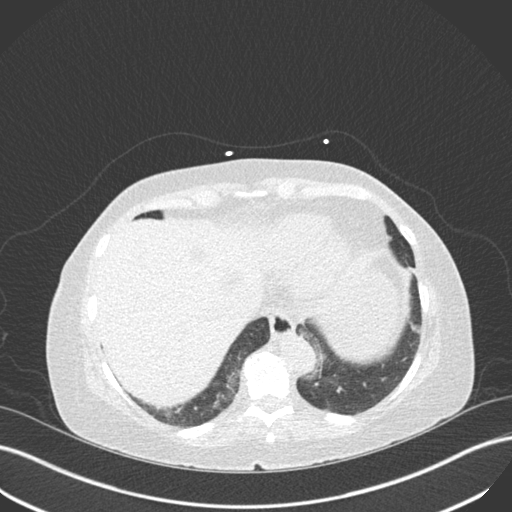
[im 16/69  vessel]
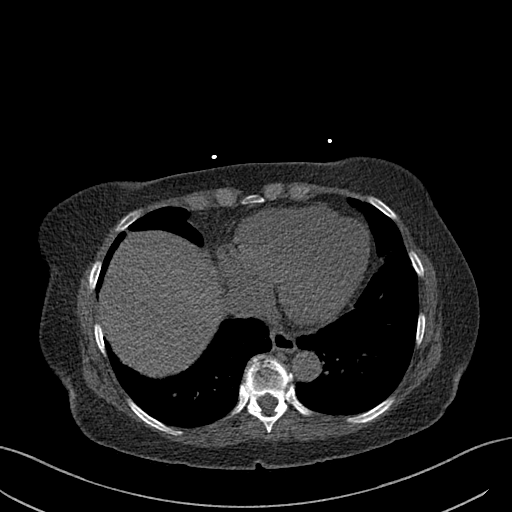
[im 23/69  vessel]
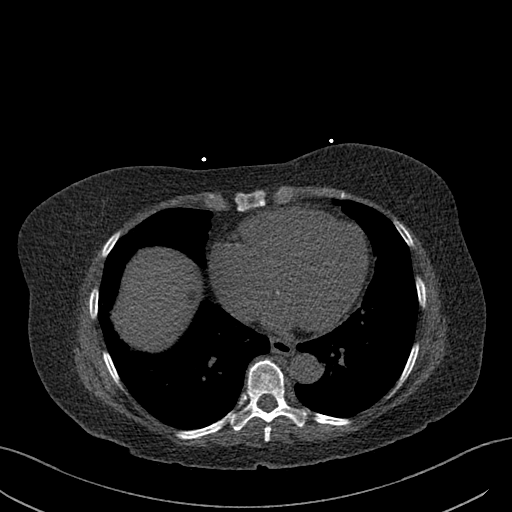
[im 31/69  vessel]
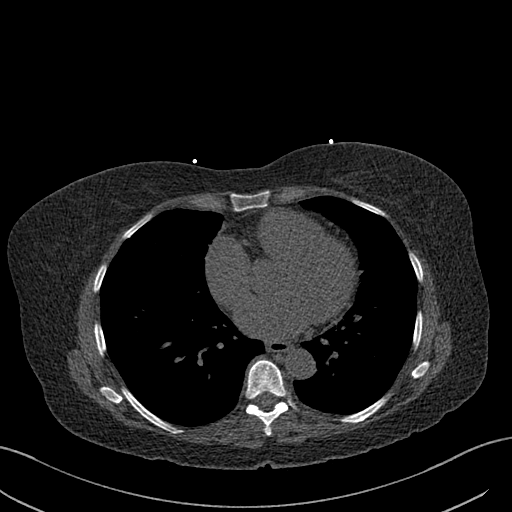
[im 38/69  vessel]
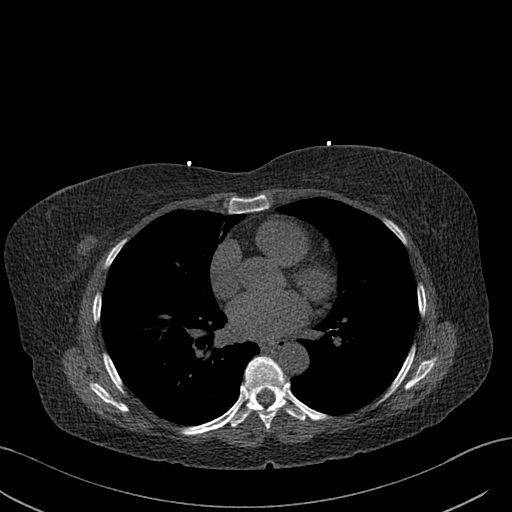
[im 38/69  lung]
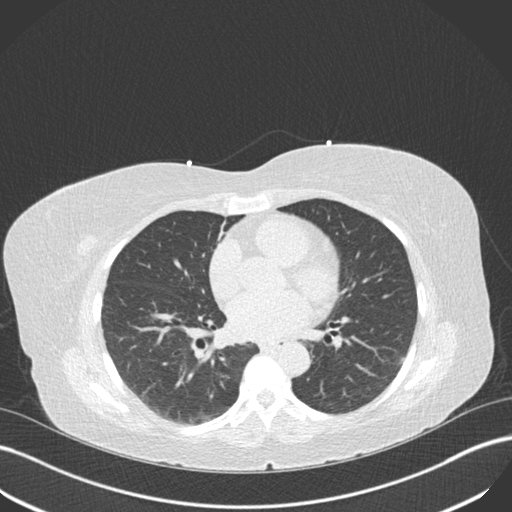
[im 46/69  vessel]
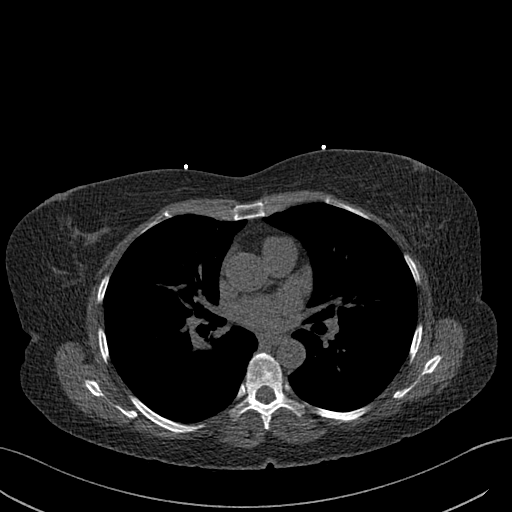
[im 53/69  vessel]
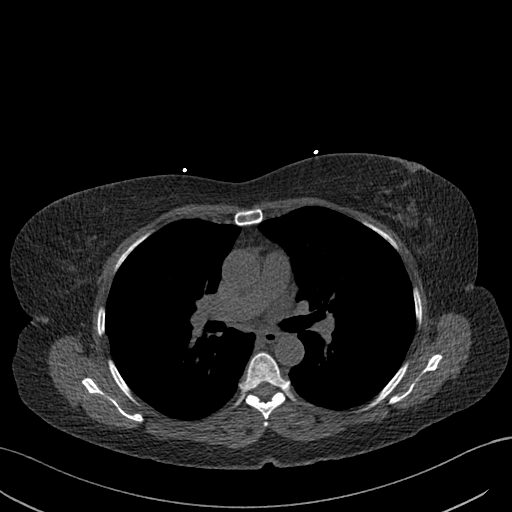
[im 61/69  vessel]
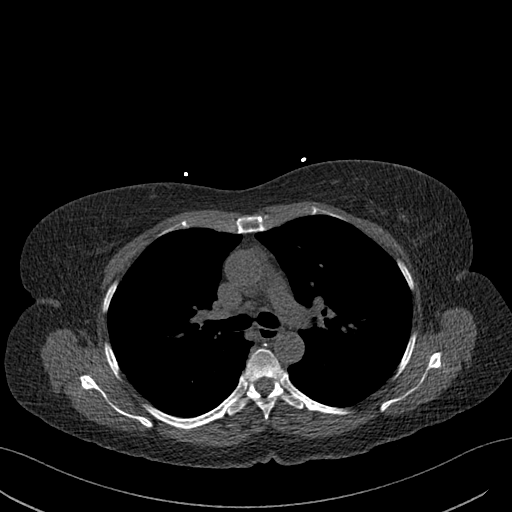

[12 of 20 positions shown; findings below may reference images not displayed]

FINDINGS: Coronary arteries: Normal origins.

Coronary Calcium Score:

Total: 0

Pericardium: Normal.

Ascending Aorta: Normal caliber.

Non-cardiac: See separate report from [REDACTED].
IMPRESSION: Coronary calcium score of 0.



If CAC=0, it is reasonable to withhold statin therapy and reassess
in 5 to 10 years, as long as higher risk conditions are absent
(diabetes mellitus, family history of premature CHD in first degree
relatives (males <55 years; females <65 years), cigarette smoking,
or LDL >=190 mg/dL).

If CAC is 1 to 99, it is reasonable to initiate statin therapy for
patients >=55 years of age.

If CAC is >=100 or >=75th percentile, it is reasonable to initiate
statin therapy at any age.

Cardiology referral should be considered for patients with CAC
scores >=400 or >=75th percentile.

*2772 AHA/ACC/AACVPR/AAPA/ABC/SATIAGO/NAZARETH/SIGORTA/Goffy/TIGER/FENIX/FARIDEH
Guideline on the Management of Blood Cholesterol: A Report of the
American College of Cardiology/American Heart Association Task Force
on Clinical Practice Guidelines. J Am Coll Cardiol.
8220;73(24):7645-7164.

EXAM:
OVER-READ INTERPRETATION  CT CHEST

The following report is an over-read performed by radiologist Dr.
Blondinacka Samsonaite [REDACTED] on 06/11/2021. This over-read
does not include interpretation of cardiac or coronary anatomy or
pathology. The coronary calcium score interpretation by the
cardiologist is attached.
FINDINGS: Cardiovascular: There are no significant extracardiac vascular
findings.

Mediastinum/Nodes: There are no enlarged lymph nodes within the
visualized mediastinum.There are scattered punctate nodular
densities with surrounding ground-glass seen within the partially
visualized right upper lobe (axial series 5, image 6), right middle
26, and 4), left upper lobe (axial image 6), and left lower lobe
(axial images 27 and 32). The largest solid nodule portions are seen
measuring 3 mm within the right upper lobe (axial image 6) and
measuring 5 mm left upper lobe (axial image 6). No pleural effusion.
Curvilinear middle lobe, lingula and left lower lobe subsegmental
atelectasis versus scarring.

Lungs/Pleura: There is no pleural effusion. The visualized lungs
appear clear.

Upper abdomen: There are multiple low-density lesion seen throughout
the liver. Those that are large enough to characterize demonstrate
fluid density suggesting cysts. The largest is seen within the
superior aspect of the medial segment of the left hepatic lobe
measuring up to 2.6 cm (axial series 4, image 59 and coronal series
602, image 81).

Musculoskeletal/Chest wall: There is streak artifact from a
partially visualized cardiac loop recorder within the superomedial
left breast. There is an approximate 1.9 cm transverse and 2.2 cm
craniocaudal lobular soft tissue density within the posterior mid
height of the right breast (axial series 4, image 28 and sagittal
series 603, image 19) possibly normal breast tissue but a cyst or
mass cannot be excluded. Recommend correlation with patient's
mammograms and standard screening.
IMPRESSION: :
IMPRESSION: 1. There are multiple partially solid and partially ground-glass
nodules within the partially visualized bilateral lungs with the
largest solid component measuring approximately 5 mm within the left
upper lobe. Reportedly the patient has a history of sarcoidosis, and
a report from 07/15/2006 CT describes right upper lobe biopsy
postsurgical changes and scattered lung nodules. No prior CT chest
imaging is available for comparison. Recommend comparison to more
recent CT of the chest to assess stability of these lung nodules. If
no priors are available for comparison, consider a new CT of the
chest to establish a baseline for surveillance of these pulmonary
nodules.
2. There is a soft tissue nodular density measuring up to
approximately 2 cm within the posterior right breast. Recommend
correlation with patient's mammograms. Note is made on the
03/17/2006 remote chest CT report description of a deep right
retroareolar right breast density on 03/17/2006 CT measuring up to
2.4 cm. If the patient has not had a mammographic evaluation within
the past year, consider mammography for further evaluation.
3. There are low-density lesions seen within the visualized superior
aspect of the liver. Those that are large enough to characterize
demonstrate fluid density indicating cysts. Numerous low-density
lesions were also described on 03/17/2006 CT report with those large
enough to characterize demonstrating fluid density of cysts.

*** End of Addendum ***
FINDINGS: Coronary arteries: Normal origins.

Coronary Calcium Score:

Total: 0

Pericardium: Normal.

Ascending Aorta: Normal caliber.

Non-cardiac: See separate report from [REDACTED].
IMPRESSION: Coronary calcium score of 0.



If CAC=0, it is reasonable to withhold statin therapy and reassess
in 5 to 10 years, as long as higher risk conditions are absent
(diabetes mellitus, family history of premature CHD in first degree
relatives (males <55 years; females <65 years), cigarette smoking,
or LDL >=190 mg/dL).

If CAC is 1 to 99, it is reasonable to initiate statin therapy for
patients >=55 years of age.

If CAC is >=100 or >=75th percentile, it is reasonable to initiate
statin therapy at any age.

Cardiology referral should be considered for patients with CAC
scores >=400 or >=75th percentile.

*2772 AHA/ACC/AACVPR/AAPA/ABC/SATIAGO/NAZARETH/SIGORTA/Goffy/TIGER/FENIX/FARIDEH
Guideline on the Management of Blood Cholesterol: A Report of the
American College of Cardiology/American Heart Association Task Force
on Clinical Practice Guidelines. J Am Coll Cardiol.
8220;73(24):7645-7164.

## 2022-06-22 ENCOUNTER — Ambulatory Visit (INDEPENDENT_AMBULATORY_CARE_PROVIDER_SITE_OTHER): Payer: Medicare (Managed Care)

## 2022-06-22 DIAGNOSIS — I639 Cerebral infarction, unspecified: Secondary | ICD-10-CM

## 2022-06-22 LAB — CUP PACEART REMOTE DEVICE CHECK
Date Time Interrogation Session: 20240505230631
Implantable Pulse Generator Implant Date: 20230120

## 2022-06-23 NOTE — Progress Notes (Signed)
Carelink Summary Report / Loop Recorder 

## 2022-07-21 NOTE — Progress Notes (Signed)
Carelink Summary Report / Loop Recorder 

## 2022-07-27 ENCOUNTER — Ambulatory Visit (INDEPENDENT_AMBULATORY_CARE_PROVIDER_SITE_OTHER): Payer: Medicare (Managed Care)

## 2022-07-27 DIAGNOSIS — I639 Cerebral infarction, unspecified: Secondary | ICD-10-CM

## 2022-07-27 LAB — CUP PACEART REMOTE DEVICE CHECK
Date Time Interrogation Session: 20240609230417
Implantable Pulse Generator Implant Date: 20230120

## 2022-08-17 ENCOUNTER — Telehealth: Payer: Self-pay

## 2022-08-17 NOTE — Telephone Encounter (Signed)
Patient reports during this time she was cleaning the floor. Reports of palpitations for 15 minutes, took her BP 130/80. Reports she sat down to rest which rate did lower during this time. Denies chest pain or shortness of breath. No BB on file. Routing to Dr. Lalla Brothers for review.

## 2022-08-17 NOTE — Telephone Encounter (Signed)
Pt is returning call.  

## 2022-08-17 NOTE — Telephone Encounter (Signed)
Following alert received from [employer] received for ILR tachy alert that occurred 6/30 @ 11:14, regular tachy rhythm, rate 200, duration 4sec.  Attempted to call patient to assess. No answer, LMTCB.     Attempted to call patent

## 2022-08-18 NOTE — Progress Notes (Signed)
Carelink Summary Report / Loop Recorder 

## 2022-08-24 NOTE — Telephone Encounter (Signed)
Spoke with the patient and scheduled her an appointment with [facility]

## 2022-08-31 ENCOUNTER — Ambulatory Visit (INDEPENDENT_AMBULATORY_CARE_PROVIDER_SITE_OTHER): Payer: Medicare (Managed Care)

## 2022-08-31 DIAGNOSIS — I639 Cerebral infarction, unspecified: Secondary | ICD-10-CM | POA: Diagnosis not present

## 2022-09-01 LAB — CUP PACEART REMOTE DEVICE CHECK
Date Time Interrogation Session: 20240714230435
Implantable Pulse Generator Implant Date: 20230120

## 2022-09-14 NOTE — Progress Notes (Signed)
Carelink Summary Report / Loop Recorder 

## 2022-09-25 NOTE — Progress Notes (Unsigned)
Cardiology Office Note Date:  09/25/2022  Patient ID:  Kristy, [name] November 02, 1955, MRN [id number] PCP:  Andi Devon, MD  Electrophysiologist: Dr. Lalla Brothers  ***refresh   Chief Complaint: *** SVT  History of Present Illness: Kristy [name] is a 67 y.o. female with history of dizziness/migraine HAs, HLD, sarcoidosis (pulmonary), stroke  Jan 2023 suffered a stroke, during her hospitalization underwent ILR implant for AFib surveillance.  1:1 SVT detected on loop, planned to come in and discuss management strategies, likely addition of a BB  No AFib to date  *** symptoms *** BP room for toprol?   Device information MDT LINQ II implanted 03/07/21 for cryptogenic stroke   Past Medical History:  Diagnosis Date   Dizziness    Dizziness and giddiness    Hyperlipidemia    Migraine    Sarcoidosis    Stress incontinence    Vertigo     Past Surgical History:  Procedure Laterality Date   BLADDER SUSPENSION N/A 12/07/2019   Procedure: TRANSVAGINAL TAPE (TVT) PROCEDURE;  Surgeon: Osborn Coho, MD;  Location: [facility] OR;  Service: Gynecology;  Laterality: N/A;   CYSTOSCOPY N/A 12/07/2019   Procedure: CYSTOSCOPY;  Surgeon: Osborn Coho, MD;  Location: [facility] OR;  Service: Gynecology;  Laterality: N/A;   LOOP RECORDER INSERTION N/A 03/07/2021   Procedure: LOOP RECORDER INSERTION;  Surgeon: Lanier Prude, MD;  Location: [facility] INVASIVE CV LAB;  Service: Cardiovascular;  Laterality: N/A;   NO PAST SURGERIES      Current Outpatient Medications  Medication Sig Dispense Refill   Ascorbic Acid (VITA-C PO) Take 1,000 mg by mouth daily.     atorvastatin (LIPITOR) 80 MG tablet TAKE 1 TABLET(80 MG) BY MOUTH DAILY 90 tablet 2   B Complex-C (SUPER B COMPLEX PO) Take 1 tablet by mouth daily.     cetirizine (ZYRTEC) 10 MG tablet Take 10 mg by mouth daily as needed for allergies.     cholecalciferol (VITAMIN D3) 25 MCG (1000 UNIT) tablet Take 1,000 Units by mouth daily.     clopidogrel  (PLAVIX) 75 MG tablet TAKE 1 TABLET(75 MG) BY MOUTH DAILY 90 tablet 2   Docusate Sodium (STOOL SOFTENER LAXATIVE PO) Take 200 mg by mouth daily.     fluticasone (FLONASE) 50 MCG/ACT nasal spray Place 1 spray into both nostrils daily as needed for allergies.     HYDROcodone-acetaminophen (NORCO/VICODIN) 5-325 MG tablet Take 1 tablet by mouth every 6 (six) hours as needed for moderate pain or severe pain. 20 tablet 0   Magnesium Oxide (MAG-OX PO) Take 400 mg by mouth 2 (two) times daily.     meclizine (ANTIVERT) 25 MG tablet Take 25 mg by mouth 3 (three) times daily as needed for dizziness.     meloxicam (MOBIC) 15 MG tablet Take 15 mg by mouth daily as needed for pain.     mirabegron ER (MYRBETRIQ) 25 MG TB24 tablet Take 25 mg by mouth daily.     naproxen sodium (ALEVE) 220 MG tablet Take 660 mg by mouth daily as needed (back pain).     Omega-3 Fatty Acids (FISH OIL) 1000 MG CAPS Take 1,000 mg by mouth daily.     ondansetron (ZOFRAN) 8 MG tablet Take 1 tablet (8 mg total) by mouth every 8 (eight) hours as needed for nausea or vomiting. 20 tablet 2   OVER THE COUNTER MEDICATION Apply 1 application topically daily as needed (pain). [place] balm     oxymetazoline (AFRIN) 0.05 % nasal  spray Place 1 spray into both nostrils daily as needed for congestion.     phentermine 37.5 MG capsule Take 37.5 mg by mouth every morning.     potassium chloride SA (KLOR-CON M) 20 MEQ tablet Take 1 tablet (20 mEq total) by mouth 2 (two) times daily for 5 days. 10 tablet 0   Probiotic Product (PROBIOTIC GUMMIES PO) Take 1 tablet by mouth daily. 500 million     scopolamine (TRANSDERM-SCOP) 1 MG/3DAYS Place 1 patch (1.5 mg total) onto the skin every 3 (three) days. 10 patch 0   Semaglutide,0.25 or 0.5MG /DOS, (OZEMPIC, 0.25 OR 0.5 MG/DOSE,) 2 MG/1.5ML SOPN Inject into the skin every Monday.     UNABLE TO FIND Take 1 tablet by mouth daily. Med Name: Fiber well gummy's     valACYclovir (VALTREX) 500 MG tablet Take 500 mg  by mouth daily as needed (fever blisters).     No current facility-administered medications for this visit.    Allergies:   Patient has no known allergies.   Social History:  The patient  reports that she has never smoked. She has never used smokeless tobacco. She reports that she does not drink alcohol and does not use drugs.   Family History:  The patient's family history includes Alzheimer's disease in her mother; Breast cancer in her maternal aunt and paternal aunt; COPD in her father.  ROS:  Please see the history of present illness.    All other systems are reviewed and otherwise negative.   PHYSICAL EXAM:  VS:  There were no vitals taken for this visit. BMI: There is no height or weight on file to calculate BMI. Well nourished, well developed, in no acute distress HEENT: normocephalic, atraumatic Neck: no JVD, carotid bruits or masses Cardiac:  *** RRR; no significant murmurs, no rubs, or gallops Lungs:  *** CTA b/l, no wheezing, rhonchi or rales Abd: soft, nontender MS: no deformity or *** atrophy Ext: *** no edema Skin: warm and dry, no rash Neuro:  No gross deficits appreciated Psych: euthymic mood, full affect  *** ILR site is stable, no tethering or discomfort   EKG:  Done today and reviewed by myself shows      Device interrogation done today and reviewed by myself:  ***   03/06/21: TTE 1. Left ventricular ejection fraction, by estimation, is 55 to 60%. The  left ventricle has normal function. The left ventricle has no regional  wall motion abnormalities. Left ventricular diastolic parameters are  consistent with Grade I diastolic  dysfunction (impaired relaxation).   2. Right ventricular systolic function is normal. The right ventricular  size is normal.   3. The mitral valve is normal in structure. Trivial mitral valve  regurgitation. No evidence of mitral stenosis.   4. The aortic valve is tricuspid. Aortic valve regurgitation is mild. No  aortic  stenosis is present.   5. The inferior vena cava is normal in size with greater than 50%  respiratory variability, suggesting right atrial pressure of 3 mmHg.   Comparison(s): No prior Echocardiogram.   Recent Labs: No results found for requested labs within last 365 days.  No results found for requested labs within last 365 days.   CrCl cannot be calculated (Patient's most recent lab result is older than the maximum 21 days allowed.).   Wt Readings from Last 3 Encounters:  06/04/21 166 lb (75.3 kg)  05/08/21 173 lb 3.2 oz (78.6 kg)  02/05/21 166 lb (75.3 kg)     Other  studies reviewed: Additional studies/records reviewed today include: summarized above  ASSESSMENT AND PLAN:  ILR *** no AFib to date  SVT ***   Disposition: F/u with ***  Current medicines are reviewed at length with the patient today.  The patient did not have any concerns regarding medicines.  Norma Fredrickson, PA-C 09/25/2022 10:04 AM     [facility] [address] [place] [address] [phone] (office)  [phone] (fax)

## 2022-09-28 ENCOUNTER — Ambulatory Visit: Payer: Medicare (Managed Care) | Attending: Physician Assistant | Admitting: Physician Assistant

## 2022-09-28 VITALS — BP 142/70 | HR 77 | Ht 64.0 in | Wt 143.6 lb

## 2022-09-28 DIAGNOSIS — Z4509 Encounter for adjustment and management of other cardiac device: Secondary | ICD-10-CM | POA: Diagnosis not present

## 2022-09-28 DIAGNOSIS — I639 Cerebral infarction, unspecified: Secondary | ICD-10-CM | POA: Diagnosis not present

## 2022-09-28 DIAGNOSIS — I471 Supraventricular tachycardia, unspecified: Secondary | ICD-10-CM

## 2022-09-28 MED ORDER — METOPROLOL SUCCINATE ER 25 MG PO TB24: 25.0000 mg | ORAL_TABLET | Freq: Every day | ORAL | 2 refills | Status: DC

## 2022-09-28 NOTE — Patient Instructions (Addendum)
Medication Instructions:   START TAKING : TOPROL XL 25 MG ONCE A DAY    *If you need a refill on your cardiac medications before your next appointment, please call your pharmacy*   Lab Work: NONE ORDERED  TODAY    If you have labs (blood work) drawn today and your tests are completely normal, you will receive your results only by: MyChart Message (if you have MyChart) OR A paper copy in the mail If you have any lab test that is abnormal or we need to change your treatment, we will call you to review the results.   Testing/Procedures: NONE ORDERED  TODAY    Follow-Up: At [facility], you and your health needs are our priority.  As part of our continuing mission to provide you with exceptional heart care, we have created designated Provider Care Teams.  These Care Teams include your primary Cardiologist (physician) and Advanced Practice Providers (APPs -  Physician Assistants and Nurse Practitioners) who all work together to provide you with the care you need, when you need it.  We recommend signing up for the patient portal called "MyChart".  Sign up information is provided on this After Visit Summary.  MyChart is used to connect with patients for Virtual Visits (Telemedicine).  Patients are able to view lab/test results, encounter notes, upcoming appointments, etc.  Non-urgent messages can be sent to your provider as well.   To learn more about what you can do with MyChart, go to [email or web address].    Your next appointment:   3 month(s)  Provider:   You will see one of the following Advanced Practice Providers on your designated Care Team:   Francis Dowse, [place]    Other Instructions

## 2022-10-05 ENCOUNTER — Ambulatory Visit (INDEPENDENT_AMBULATORY_CARE_PROVIDER_SITE_OTHER): Payer: Medicare (Managed Care)

## 2022-10-05 DIAGNOSIS — I639 Cerebral infarction, unspecified: Secondary | ICD-10-CM

## 2022-10-05 LAB — CUP PACEART REMOTE DEVICE CHECK
Date Time Interrogation Session: 20240816230349
Implantable Pulse Generator Implant Date: 20230120

## 2022-10-15 NOTE — Progress Notes (Signed)
Carelink Summary Report / Loop Recorder 

## 2022-11-05 LAB — CUP PACEART REMOTE DEVICE CHECK
Date Time Interrogation Session: 20240918230455
Implantable Pulse Generator Implant Date: 20230120

## 2022-11-09 ENCOUNTER — Ambulatory Visit (INDEPENDENT_AMBULATORY_CARE_PROVIDER_SITE_OTHER): Payer: Medicare (Managed Care)

## 2022-11-09 DIAGNOSIS — I639 Cerebral infarction, unspecified: Secondary | ICD-10-CM | POA: Diagnosis not present

## 2022-11-20 NOTE — Progress Notes (Signed)
Carelink Summary Report / Loop Recorder 

## 2022-12-14 ENCOUNTER — Ambulatory Visit (INDEPENDENT_AMBULATORY_CARE_PROVIDER_SITE_OTHER): Payer: Medicare (Managed Care)

## 2022-12-14 DIAGNOSIS — I639 Cerebral infarction, unspecified: Secondary | ICD-10-CM | POA: Diagnosis not present

## 2022-12-15 LAB — CUP PACEART REMOTE DEVICE CHECK
Date Time Interrogation Session: 20241027230817
Implantable Pulse Generator Implant Date: 20230120

## 2022-12-27 NOTE — Progress Notes (Unsigned)
Cardiology Office Note Date:  12/27/2022  Patient ID:  Kristy [name], Kristy [name] October 28, 1955, MRN [id number] PCP:  Andi Devon, MD  Electrophysiologist: Dr. Lalla Brothers     Chief Complaint:  *** 3 mo planned visit  History of Present Illness: Kristy [name] is a 67 y.o. female with history of dizziness/migraine HAs, HLD, sarcoidosis (pulmonary), stroke  Jan 2023 suffered a stroke, during her hospitalization underwent ILR implant for AFib surveillance.  1:1 SVT detected on loop, planned to come in and discuss management strategies, likely addition of a BB  No AFib to date  I sw her in Aug 2024 She thinks that the couple days leading up to the day of the tachycardia may have contributed. She had sone some yard work, did cleaning in her and a neighbors house with clorox, and cleaners, and helped her sister with some cleaning as well, may have over done it/perhaps the cleaning chemical fumes. She was well aware of the tachycardia, made her feel weak. Has not had that previously No CP, no SOB Does not exercise, but is very busy all of the time. No near syncope or syncope.  She reports dizziness, this is not new, known to have vertigo Definitely a feeling of motion, sometimes so severe she falls/has to get to seated, to help her feel less intense. Goes back years Pre-stroke even She had self stopped her plavix, statin felt they were making her feel poorly She assures me in communication with her PMD, and that they are watching her cholesterol closely  Started on Toprol for her SVT and some PVCs noted during device interrogation (PVC counter programmed on) Advised f/u with her PMD for her vertigo  *** symptoms *** AFib? *** PVCs? *** brady, tol BB *** Vertigo?    Device information MDT LINQ II implanted 03/07/21 for cryptogenic stroke   Past Medical History:  Diagnosis Date   Dizziness    Dizziness and giddiness    Hyperlipidemia    Migraine    Sarcoidosis    Stress  incontinence    Vertigo     Past Surgical History:  Procedure Laterality Date   BLADDER SUSPENSION N/A 12/07/2019   Procedure: TRANSVAGINAL TAPE (TVT) PROCEDURE;  Surgeon: Osborn Coho, MD;  Location: [facility] OR;  Service: Gynecology;  Laterality: N/A;   CYSTOSCOPY N/A 12/07/2019   Procedure: CYSTOSCOPY;  Surgeon: Osborn Coho, MD;  Location[facility] OR;  Service: Gynecology;  Laterality: N/A;   LOOP RECORDER INSERTION N/A 03/07/2021   Procedure: LOOP RECORDER INSERTION;  Surgeon: Lanier Prude, MD;  Location[facility] INVASIVE CV LAB;  Service: Cardiovascular;  Laterality: N/A;   NO PAST SURGERIES      Current Outpatient Medications  Medication Sig Dispense Refill   Ascorbic Acid (VITA-C PO) Take 1,000 mg by mouth daily.     atorvastatin (LIPITOR) 80 MG tablet TAKE 1 TABLET(80 MG) BY MOUTH DAILY (Patient not taking: Reported on 09/28/2022) 90 tablet 2   B Complex-C (SUPER B COMPLEX PO) Take 1 tablet by mouth daily.     cetirizine (ZYRTEC) 10 MG tablet Take 10 mg by mouth daily as needed for allergies.     cholecalciferol (VITAMIN D3) 25 MCG (1000 UNIT) tablet Take 1,000 Units by mouth daily. (Patient not taking: Reported on 09/28/2022)     clopidogrel (PLAVIX) 75 MG tablet TAKE 1 TABLET(75 MG) BY MOUTH DAILY (Patient not taking: Reported on 09/28/2022) 90 tablet 2   Docusate Sodium (STOOL SOFTENER LAXATIVE PO) Take 200 mg by mouth  daily.     fluticasone (FLONASE) 50 MCG/ACT nasal spray Place 1 spray into both nostrils daily as needed for allergies.     HYDROcodone-acetaminophen (NORCO/VICODIN) 5-325 MG tablet Take 1 tablet by mouth every 6 (six) hours as needed for moderate pain or severe pain. 20 tablet 0   Magnesium Oxide (MAG-OX PO) Take 400 mg by mouth 2 (two) times daily.     meclizine (ANTIVERT) 25 MG tablet Take 25 mg by mouth 3 (three) times daily as needed for dizziness.     meloxicam (MOBIC) 15 MG tablet Take 15 mg by mouth daily as needed for pain.     metoprolol succinate (TOPROL XL)  25 MG 24 hr tablet Take 1 tablet (25 mg total) by mouth daily. 90 tablet 2   mirabegron ER (MYRBETRIQ) 25 MG TB24 tablet Take 25 mg by mouth daily.     naproxen sodium (ALEVE) 220 MG tablet Take 660 mg by mouth daily as needed (back pain).     Omega-3 Fatty Acids (FISH OIL) 1000 MG CAPS Take 1,000 mg by mouth daily.     ondansetron (ZOFRAN) 8 MG tablet Take 1 tablet (8 mg total) by mouth every 8 (eight) hours as needed for nausea or vomiting. 20 tablet 2   OVER THE COUNTER MEDICATION Apply 1 application topically daily as needed (pain). magnesport balm     oxymetazoline (AFRIN) 0.05 % nasal spray Place 1 spray into both nostrils daily as needed for congestion.     phentermine 37.5 MG capsule Take 37.5 mg by mouth every morning.     potassium chloride SA (KLOR-CON M) 20 MEQ tablet Take 1 tablet (20 mEq total) by mouth 2 (two) times daily for 5 days. 10 tablet 0   Probiotic Product (PROBIOTIC GUMMIES PO) Take 1 tablet by mouth daily. 500 million     scopolamine (TRANSDERM-SCOP) 1 MG/3DAYS Place 1 patch (1.5 mg total) onto the skin every 3 (three) days. 10 patch 0   Semaglutide,0.25 or 0.5MG /DOS, (OZEMPIC, 0.25 OR 0.5 MG/DOSE,) 2 MG/1.5ML SOPN Inject into the skin every Monday.     UNABLE TO FIND Take 1 tablet by mouth daily. Med Name: Fiber well gummy's     valACYclovir (VALTREX) 500 MG tablet Take 500 mg by mouth daily as needed (fever blisters).     No current facility-administered medications for this visit.    Allergies:   Patient has no known allergies.   Social History:  The patient  reports that she has never smoked. She has never used smokeless tobacco. She reports that she does not drink alcohol and does not use drugs.   Family History:  The patient's family history includes Alzheimer's disease in her mother; Breast cancer in her maternal aunt and paternal aunt; COPD in her father.  ROS:  Please see the history of present illness.    All other systems are reviewed and otherwise  negative.   PHYSICAL EXAM:  VS:  There were no vitals taken for this visit. BMI: There is no height or weight on file to calculate BMI. Well nourished, well developed, in no acute distress HEENT: normocephalic, atraumatic Neck: no JVD, carotid bruits or masses Cardiac:  *** RRR; no significant murmurs, no rubs, or gallops Lungs:  *** CTA b/l, no wheezing, rhonchi or rales Abd: soft, nontender MS: no deformity or atrophy Ext: *** no edema Skin: warm and dry, no rash Neuro:  No gross deficits appreciated Psych: euthymic mood, full affect  *** ILR site is stable, no  tethering or discomfort   EKG:  not done today  Device interrogation done today and reviewed by myself:  Battery is good R waves ***  *** One SVT (known) *** No AFib    03/06/21: TTE 1. Left ventricular ejection fraction, by estimation, is 55 to 60%. The  left ventricle has normal function. The left ventricle has no regional  wall motion abnormalities. Left ventricular diastolic parameters are  consistent with Grade I diastolic  dysfunction (impaired relaxation).   2. Right ventricular systolic function is normal. The right ventricular  size is normal.   3. The mitral valve is normal in structure. Trivial mitral valve  regurgitation. No evidence of mitral stenosis.   4. The aortic valve is tricuspid. Aortic valve regurgitation is mild. No  aortic stenosis is present.   5. The inferior vena cava is normal in size with greater than 50%  respiratory variability, suggesting right atrial pressure of 3 mmHg.   Comparison(s): No prior Echocardiogram.   Recent Labs: No results found for requested labs within last 365 days.  No results found for requested labs within last 365 days.   CrCl cannot be calculated (Patient's most recent lab result is older than the maximum 21 days allowed.).   Wt Readings from Last 3 Encounters:  09/28/22 143 lb 9.6 oz (65.1 kg)  06/04/21 166 lb (75.3 kg)  05/08/21 173 lb 3.2 oz (78.6  kg)     Other studies reviewed: Additional studies/records reviewed today include: summarized above  ASSESSMENT AND PLAN:  ILR *** no AFib to date  SVT ***  3. Dizziness Meclizine helps Worse of late *** I did urge her to revisit with her PMD, consider ASA 91mg  daily at least F/u on her dizziness/management   Disposition:  ***  Current medicines are reviewed at length with the patient today.  The patient did not have any concerns regarding medicines.  Norma Fredrickson, PA-C 12/27/2022 10:41 AM     [facility] [address] [place] [address] [phone] (office)  [phone] (fax)

## 2022-12-29 ENCOUNTER — Encounter: Payer: Self-pay | Admitting: Physician Assistant

## 2022-12-29 ENCOUNTER — Ambulatory Visit: Payer: Medicare (Managed Care) | Attending: Physician Assistant | Admitting: Physician Assistant

## 2022-12-29 VITALS — BP 124/72 | HR 78 | Ht 64.0 in | Wt 147.2 lb

## 2022-12-29 DIAGNOSIS — I493 Ventricular premature depolarization: Secondary | ICD-10-CM

## 2022-12-29 DIAGNOSIS — Z4509 Encounter for adjustment and management of other cardiac device: Secondary | ICD-10-CM

## 2022-12-29 DIAGNOSIS — I471 Supraventricular tachycardia, unspecified: Secondary | ICD-10-CM

## 2022-12-29 NOTE — Patient Instructions (Signed)
Medication Instructions:  Your physician recommends that you continue on your current medications as directed. Please refer to the Current Medication list given to you today.  *If you need a refill on your cardiac medications before your next appointment, please call your pharmacy*   Lab Work: NONE If you have labs (blood work) drawn today and your tests are completely normal, you will receive your results only by: MyChart Message (if you have MyChart) OR A paper copy in the mail If you have any lab test that is abnormal or we need to change your treatment, we will call you to review the results.   Testing/Procedures: NONE   Follow-Up: At [facility], you and your health needs are our priority.  As part of our continuing mission to provide you with exceptional heart care, we have created designated Provider Care Teams.  These Care Teams include your primary Cardiologist (physician) and Advanced Practice Providers (APPs -  Physician Assistants and Nurse Practitioners) who all work together to provide you with the care you need, when you need it.  We recommend signing up for the patient portal called "MyChart".  Sign up information is provided on this After Visit Summary.  MyChart is used to connect with patients for Virtual Visits (Telemedicine).  Patients are able to view lab/test results, encounter notes, upcoming appointments, etc.  Non-urgent messages can be sent to your provider as well.   To learn more about what you can do with MyChart, go to [email or web address].    Your next appointment:   6 month(s)  Provider:   Francis Dowse, PA-C

## 2023-01-01 NOTE — Progress Notes (Signed)
Carelink Summary Report / Loop Recorder 

## 2023-01-02 ENCOUNTER — Other Ambulatory Visit: Payer: Self-pay

## 2023-01-02 ENCOUNTER — Encounter (HOSPITAL_BASED_OUTPATIENT_CLINIC_OR_DEPARTMENT_OTHER): Payer: Self-pay

## 2023-01-02 ENCOUNTER — Emergency Department (HOSPITAL_BASED_OUTPATIENT_CLINIC_OR_DEPARTMENT_OTHER): Payer: Medicare (Managed Care)

## 2023-01-02 ENCOUNTER — Emergency Department (HOSPITAL_BASED_OUTPATIENT_CLINIC_OR_DEPARTMENT_OTHER)
Admission: EM | Admit: 2023-01-02 | Discharge: 2023-01-02 | Disposition: A | Discharge status: Home / Self Care | Payer: Medicare (Managed Care) | Attending: Emergency Medicine | Admitting: Emergency Medicine

## 2023-01-02 DIAGNOSIS — W19XXXA Unspecified fall, initial encounter: Secondary | ICD-10-CM | POA: Diagnosis not present

## 2023-01-02 DIAGNOSIS — S60947A Unspecified superficial injury of left little finger, initial encounter: Secondary | ICD-10-CM | POA: Diagnosis present

## 2023-01-02 DIAGNOSIS — S63257A Unspecified dislocation of left little finger, initial encounter: Secondary | ICD-10-CM | POA: Diagnosis not present

## 2023-01-02 DIAGNOSIS — Y9301 Activity, walking, marching and hiking: Secondary | ICD-10-CM | POA: Insufficient documentation

## 2023-01-02 MED ORDER — LIDOCAINE HCL (PF) 1 % IJ SOLN
5.0000 mL | Freq: Once | INTRAMUSCULAR | Status: DC
  Filled 2023-01-02: qty 5

## 2023-01-02 MED ORDER — HYDROCODONE-ACETAMINOPHEN 5-325 MG PO TABS
1.0000 | ORAL_TABLET | Freq: Once | ORAL | Status: AC
  Administered 2023-01-02: 1 via ORAL
  Filled 2023-01-02: qty 1

## 2023-01-02 MED ORDER — HYDROCODONE-ACETAMINOPHEN 5-325 MG PO TABS: 1.0000 | ORAL_TABLET | Freq: Four times a day (QID) | ORAL | 0 refills | Status: DC | PRN

## 2023-01-02 NOTE — Discharge Instructions (Addendum)
Keep the finger splint in place.  You can shower with it in place.  Make an appointment on Monday to be seen by [facility].  Sounds like maybe you are followed by sports medicine there already.  X-ray today did not show any fractures.  But did show a PIP joint dislocation.  The abrasion on the inside of the PIP joint does not seem to communicate with the joint.  Elevate the left hand is much as possible.  Recommend extra strength Tylenol as needed for pain.  2 tablets every 8 hours.  Also a short course of hydrocodone was sent to your pharmacy which you can pick up tomorrow.  You did receive hydrocodone here tonight.  Cannot take any additional Tylenol for 6 hours from the time that you received the hydrocodone.  Also you cannot take extra strength Tylenol along with hydrocodone because they both have Tylenol in them.

## 2023-01-02 NOTE — ED Notes (Signed)
Wound cleaned and bandage applied. Frog splint applied to finger.

## 2023-01-02 NOTE — ED Provider Notes (Addendum)
[place] [facility] Provider Note   CSN: [id number] Arrival date & time: 01/02/23  8756     History  Chief Complaint  Patient presents with   Finger Injury    Kristy [name] is a 67 y.o. female.  Patient was walking her dog tonight around 6 PM.  She fell down she used her left hand to try to block her fall.  And she ended up getting a deformity of her left little finger.  At the PIP joint.  There is an abrasion on the side of the knuckle but no laceration.  Nothing that appears to be an open fracture or dislocation.  Patient denies any other injuries.  Patient has no allergies to pain medicine past medical history significant for sarcoidosis hyperlipidemia migraines dizziness vertigo.  Patient does not use any tobacco products.  Patient last seen in the emergency department April 2023 for vertigo.  Patient denies hitting her head any neck pain back pain hip pain other extremity pain.       Home Medications Prior to Admission medications   Medication Sig Start Date End Date Taking? Authorizing Provider  Ascorbic Acid (VITA-C PO) Take 1,000 mg by mouth daily.    [provider]  atorvastatin (LIPITOR) 80 MG tablet TAKE 1 TABLET(80 MG) BY MOUTH DAILY 06/05/21   Micki Riley, MD  B Complex-C (SUPER B COMPLEX PO) Take 1 tablet by mouth daily.    [provider]  cetirizine (ZYRTEC) 10 MG tablet Take 10 mg by mouth daily as needed for allergies.    [provider]  cholecalciferol (VITAMIN D3) 25 MCG (1000 UNIT) tablet Take 1,000 Units by mouth daily.    [provider]  clopidogrel (PLAVIX) 75 MG tablet TAKE 1 TABLET(75 MG) BY MOUTH DAILY 06/05/21   Micki Riley, MD  Docusate Sodium (STOOL SOFTENER LAXATIVE PO) Take 200 mg by mouth daily.    [provider]  fluticasone (FLONASE) 50 MCG/ACT nasal spray Place 1 spray into both nostrils daily as needed for allergies.    [provider]   HYDROcodone-acetaminophen (NORCO/VICODIN) 5-325 MG tablet Take 1 tablet by mouth every 6 (six) hours as needed for moderate pain or severe pain. 12/08/19   Osborn Coho, MD  Magnesium Oxide (MAG-OX PO) Take 400 mg by mouth 2 (two) times daily.    [provider]  meclizine (ANTIVERT) 25 MG tablet Take 25 mg by mouth 3 (three) times daily as needed for dizziness.    [provider]  meloxicam (MOBIC) 15 MG tablet Take 15 mg by mouth daily as needed for pain.    [provider]  metoprolol succinate (TOPROL XL) 25 MG 24 hr tablet Take 1 tablet (25 mg total) by mouth daily. 09/28/22   Sheilah Pigeon, PA-C  mirabegron ER (MYRBETRIQ) 25 MG TB24 tablet Take 25 mg by mouth daily.    [provider]  naproxen sodium (ALEVE) 220 MG tablet Take 660 mg by mouth daily as needed (back pain).    [provider]  Omega-3 Fatty Acids (FISH OIL) 1000 MG CAPS Take 1,000 mg by mouth daily.    [provider]  ondansetron (ZOFRAN) 8 MG tablet Take 1 tablet (8 mg total) by mouth every 8 (eight) hours as needed for nausea or vomiting. 02/05/21   Ocie Doyne, MD  OVER THE COUNTER MEDICATION Apply 1 application topically daily as needed (pain). magnesport balm    [provider]  oxymetazoline (AFRIN) 0.05 % nasal spray Place 1 spray into both nostrils daily as needed for congestion.    [provider]  phentermine 37.5 MG capsule Take 37.5 mg by mouth every morning.    [provider]  potassium chloride SA (KLOR-CON M) 20 MEQ tablet Take 1 tablet (20 mEq total) by mouth 2 (two) times daily for 5 days. 06/04/21 06/09/21  Haskel Schroeder, PA-C  Probiotic Product (PROBIOTIC GUMMIES PO) Take 1 tablet by mouth daily. 500 million    [provider]  scopolamine (TRANSDERM-SCOP) 1 MG/3DAYS Place 1 patch (1.5 mg total) onto the skin every 3 (three) days. 06/04/21   Haskel Schroeder, PA-C  Semaglutide,0.25 or 0.5MG /DOS,  (OZEMPIC, 0.25 OR 0.5 MG/DOSE,) 2 MG/1.5ML SOPN Inject into the skin every Monday.    [provider]  UNABLE TO FIND Take 1 tablet by mouth daily. Med Name: Fiber well gummy's    [provider]  valACYclovir (VALTREX) 500 MG tablet Take 500 mg by mouth daily as needed (fever blisters).    [provider]      Allergies    Patient has no known allergies.    Review of Systems   Review of Systems  Constitutional:  Negative for chills and fever.  HENT:  Negative for ear pain and sore throat.   Eyes:  Negative for pain and visual disturbance.  Respiratory:  Negative for cough and shortness of breath.   Cardiovascular:  Negative for chest pain and palpitations.  Gastrointestinal:  Negative for abdominal pain and vomiting.  Genitourinary:  Negative for dysuria and hematuria.  Musculoskeletal:  Positive for joint swelling. Negative for arthralgias and back pain.  Skin:  Negative for color change and rash.  Neurological:  Negative for seizures and syncope.  All other systems reviewed and are negative.   Physical Exam Updated Vital Signs BP (!) 148/71 (BP Location: Right Arm)   Pulse 86   Temp 98 F (36.7 C) (Oral)   Resp 16   Ht 1.6 m (5\' 3" )   Wt 66.7 kg   SpO2 99%   BMI 26.04 kg/m  Physical Exam Vitals and nursing note reviewed.  Constitutional:      General: She is not in acute distress.    Appearance: Normal appearance. She is well-developed.  HENT:     Head: Normocephalic and atraumatic.  Eyes:     Extraocular Movements: Extraocular movements intact.     Conjunctiva/sclera: Conjunctivae normal.     Pupils: Pupils are equal, round, and reactive to light.  Cardiovascular:     Rate and Rhythm: Normal rate and regular rhythm.     Heart sounds: No murmur heard. Pulmonary:     Effort: Pulmonary effort is normal. No respiratory distress.     Breath sounds: Normal breath sounds.  Abdominal:     Palpations: Abdomen is soft.     Tenderness: There  is no abdominal tenderness.  Musculoskeletal:        General: Deformity and signs of injury present. No swelling.     Cervical back: Normal range of motion and neck supple.     Comments: Deformity of the left little finger at the PIP joint.  Consistent with dislocation.  Cap refill intact distally.  There is a small abrasion along the side of the knuckle.  No open wounds.  Hand otherwise wrist elbow shoulder without any abnormalities.  Radial pulse 2+.  Skin:    General: Skin is warm and dry.  Capillary Refill: Capillary refill takes less than 2 seconds.  Neurological:     General: No focal deficit present.     Mental Status: She is alert and oriented to person, place, and time.  Psychiatric:        Mood and Affect: Mood normal.     ED Results / Procedures / Treatments   Labs (all labs ordered are listed, but only abnormal results are displayed) Labs Reviewed - No data to display  EKG None  Radiology No results found.  Procedures Reduction of dislocation  Date/Time: 01/02/2023 8:42 PM  Performed by: Vanetta Mulders, MD Authorized by: Vanetta Mulders, MD  Consent: Verbal consent obtained. Consent given by: patient Patient understanding: patient states understanding of the procedure being performed Test results: test results available and properly labeled Imaging studies: imaging studies available Patient identity confirmed: verbally with patient Time out: Immediately prior to procedure a "time out" was called to verify the correct patient, procedure, equipment, support staff and site/side marked as required. Local anesthesia used: no  Anesthesia: Local anesthesia used: no  Sedation: Patient sedated: no  Patient tolerance: patient tolerated the procedure well with no immediate complications Comments: Patient's left little finger PIP joint dislocation was easily reduced manually.  Patient without any significant pain.       Medications Ordered in  ED Medications  HYDROcodone-acetaminophen (NORCO/VICODIN) 5-325 MG per tablet 1 tablet (has no administration in time range)  lidocaine (PF) (XYLOCAINE) 1 % injection 5 mL (has no administration in time range)    ED Course/ Medical Decision Making/ A&P                                 Medical Decision Making Amount and/or Complexity of Data Reviewed Radiology: ordered.  Risk Prescription drug management.   Patient with probable dislocation at the PIP joint of the left little finger.  X-ray reviewed by me.  There seems to be a dislocation at the PIP do not see any fractures.  Formal radiology report pending.  In the meantime we will give patient some hydrocodone for the pain.  Will plan on doing a finger block and then reducing this dislocation and then splint.  Will have him follow-up with hand surgery.  X-ray formally consistent with dislocation fifth PIP joint.  No evidence of any fractures.  Patient opted not to have the finger block.  So I just reduced it with traction countertraction.  We placed a splint on with a little bit of curvature to the little finger.  Also evaluated the abrasion on the radial side of the little finger does not appear to communicate with the joint.  Do not feel that that is consistent with any open joint injury.  Patient already followed by [facility] but will have her contact them on Monday for follow-up.  Dr. Yehuda Budd from [facility] is on for hand consultations tonight.  Postreduction patient with good movement at the PIP joint and DIP joint.  Good cap refill sensation still intact.  Patient with full range of motion.  Splinted with a little bit of curvature.  Will follow-up with [facility] as stated above.  Do not feel that we need a postreduction x-ray.   Final Clinical Impression(s) / ED Diagnoses Final diagnoses:  Closed dislocation of left little finger    Rx / DC Orders ED Discharge Orders     None         Vanetta Mulders,  MD 01/02/23 7829    Vanetta Mulders, MD 01/02/23 1924    Vanetta Mulders, MD 01/02/23 5621    Vanetta Mulders, MD 01/02/23 2044

## 2023-01-02 NOTE — ED Triage Notes (Signed)
Pt reports she fell down and walking her dog tonight at 1800. She reports her hand tried to block her fall. She presents with deformity to left pinky finger with abrasion to side of her knuckle.

## 2023-01-18 ENCOUNTER — Ambulatory Visit (INDEPENDENT_AMBULATORY_CARE_PROVIDER_SITE_OTHER): Payer: Medicare (Managed Care)

## 2023-01-18 DIAGNOSIS — I639 Cerebral infarction, unspecified: Secondary | ICD-10-CM | POA: Diagnosis not present

## 2023-01-18 LAB — CUP PACEART REMOTE DEVICE CHECK
Date Time Interrogation Session: 20241130230603
Implantable Pulse Generator Implant Date: 20230120

## 2023-02-22 ENCOUNTER — Telehealth: Payer: Self-pay | Admitting: Physician Assistant

## 2023-02-22 NOTE — Telephone Encounter (Signed)
 Paper Work Dropped Off: [facility] preop paperwork  Date: 02/22/23   Location of paper:  Dr. Bradly Chris box

## 2023-02-26 ENCOUNTER — Ambulatory Visit (HOSPITAL_COMMUNITY): Payer: Self-pay | Admitting: Orthopedic Surgery

## 2023-03-17 ENCOUNTER — Encounter (HOSPITAL_COMMUNITY): Payer: Self-pay

## 2023-03-17 NOTE — Pre-Procedure Instructions (Signed)
Surgical Instructions   Your procedure is scheduled on Thursday, February 6th. Report to [facility] Main Entrance "A" at 05:30 A.M., then check in with the Admitting office. Any questions or running late day of surgery: call [phone]  Questions prior to your surgery date: call [phone], Monday-Friday, 8am-4pm. If you experience any cold or flu symptoms such as cough, fever, chills, shortness of breath, etc. between now and your scheduled surgery, please notify us at the above number.     Remember:  Do not eat after midnight the night before your surgery  You may drink clear liquids until 04:30 AM the morning of your surgery.   Clear liquids allowed are: Water, Non-Citrus Juices (without pulp), Carbonated Beverages, Clear Tea (no milk, honey, etc.), Black Coffee Only (NO MILK, CREAM OR POWDERED CREAMER of any kind), and Gatorade.    Take these medicines the morning of surgery with A SIP OF WATER  metoprolol succinate (TOPROL XL)  mirabegron ER (MYRBETRIQ)     May take these medicines IF NEEDED: cetirizine (ZYRTEC)  fluticasone (FLONASE)  HYDROcodone-acetaminophen (NORCO/VICODIN)  meclizine (ANTIVERT)  ondansetron (ZOFRAN)  oxymetazoline (AFRIN)    Stop taking phentermine 2 weeks prior to surgery or ASAP.    Follow your surgeon's instructions on when to stop Aspirin.  If no instructions were given by your surgeon then you will need to call the office to get those instructions.    One week prior to surgery, STOP taking any Aspirin (unless otherwise instructed by your surgeon) Aleve, Naproxen, Ibuprofen, Motrin, Advil, Goody's, BC's, all herbal medications, fish oil, and non-prescription vitamins. This includes meloxicam (MOBIC).                     Do NOT Smoke (Tobacco/Vaping) for 24 hours prior to your procedure.  If you use a CPAP at night, you may bring your mask/headgear for your overnight stay.   You will be asked to remove any contacts, glasses, piercing's,  hearing aid's, dentures/partials prior to surgery. Please bring cases for these items if needed.    Patients discharged the day of surgery will not be allowed to drive home, and someone needs to stay with them for 24 hours.  SURGICAL WAITING ROOM VISITATION Patients may have no more than 2 support people in the waiting area - these visitors may rotate.   Pre-op nurse will coordinate an appropriate time for 1 ADULT support person, who may not rotate, to accompany patient in pre-op.  Children under the age of 77 must have an adult with them who is not the patient and must remain in the main waiting area with an adult.  If the patient needs to stay at the hospital during part of their recovery, the visitor guidelines for inpatient rooms apply.  Please refer to the [facility] website for the visitor guidelines for any additional information.   If you received a COVID test during your pre-op visit  it is requested that you wear a mask when out in public, stay away from anyone that may not be feeling well and notify your surgeon if you develop symptoms. If you have been in contact with anyone that has tested positive in the last 10 days please notify you surgeon.      Pre-operative 5 CHG Bathing Instructions   You can play a key role in reducing the risk of infection after surgery. Your skin needs to be as free of germs as possible. You can reduce the number of germs on  your skin by washing with CHG (chlorhexidine gluconate) soap before surgery. CHG is an antiseptic soap that kills germs and continues to kill germs even after washing.   DO NOT use if you have an allergy to chlorhexidine/CHG or antibacterial soaps. If your skin becomes reddened or irritated, stop using the CHG and notify one of our RNs at [phone].   Please shower with the CHG soap starting 4 days before surgery using the following schedule:     Please keep in mind the following:  DO NOT shave, including legs and  underarms, starting the day of your first shower.   You may shave your face at any point before/day of surgery.  Place clean sheets on your bed the day you start using CHG soap. Use a clean washcloth (not used since being washed) for each shower. DO NOT sleep with pets once you start using the CHG.   CHG Shower Instructions:  Wash your face and private area with normal soap. If you choose to wash your hair, wash first with your normal shampoo.  After you use shampoo/soap, rinse your hair and body thoroughly to remove shampoo/soap residue.  Turn the water OFF and apply about 3 tablespoons (45 ml) of CHG soap to a CLEAN washcloth.  Apply CHG soap ONLY FROM YOUR NECK DOWN TO YOUR TOES (washing for 3-5 minutes)  DO NOT use CHG soap on face, private areas, open wounds, or sores.  Pay special attention to the area where your surgery is being performed.  If you are having back surgery, having someone wash your back for you may be helpful. Wait 2 minutes after CHG soap is applied, then you may rinse off the CHG soap.  Pat dry with a clean towel  Put on clean clothes/pajamas   If you choose to wear lotion, please use ONLY the CHG-compatible lotions that are listed below.  Additional instructions for the day of surgery: DO NOT APPLY any lotions, deodorants, cologne, or perfumes.   Do not bring valuables to the hospital. [facility] is not responsible for any belongings/valuables. Do not wear nail polish, gel polish, artificial nails, or any other type of covering on natural nails (fingers and toes) Do not wear jewelry or makeup Put on clean/comfortable clothes.  Please brush your teeth.  Ask your nurse before applying any prescription medications to the skin.     CHG Compatible Lotions   Aveeno Moisturizing lotion  Cetaphil Moisturizing Cream  Cetaphil Moisturizing Lotion  Clairol Herbal Essence Moisturizing Lotion, Dry Skin  Clairol Herbal Essence Moisturizing Lotion, Extra Dry Skin   Clairol Herbal Essence Moisturizing Lotion, Normal Skin  Curel Age Defying Therapeutic Moisturizing Lotion with Alpha Hydroxy  Curel Extreme Care Body Lotion  Curel Soothing Hands Moisturizing Hand Lotion  Curel Therapeutic Moisturizing Cream, Fragrance-Free  Curel Therapeutic Moisturizing Lotion, Fragrance-Free  Curel Therapeutic Moisturizing Lotion, Original Formula  Eucerin Daily Replenishing Lotion  Eucerin Dry Skin Therapy Plus Alpha Hydroxy Crme  Eucerin Dry Skin Therapy Plus Alpha Hydroxy Lotion  Eucerin Original Crme  Eucerin Original Lotion  Eucerin Plus Crme Eucerin Plus Lotion  Eucerin TriLipid Replenishing Lotion  Keri Anti-Bacterial Hand Lotion  Keri Deep Conditioning Original Lotion Dry Skin Formula Softly Scented  Keri Deep Conditioning Original Lotion, Fragrance Free Sensitive Skin Formula  Keri Lotion Fast Absorbing Fragrance Free Sensitive Skin Formula  Keri Lotion Fast Absorbing Softly Scented Dry Skin Formula  Keri Original Lotion  Keri Skin Renewal Lotion Keri Silky Smooth Lotion  Keri Silky Smooth Sensitive  Skin Lotion  Nivea Body Creamy Conditioning Oil  Nivea Body Extra Enriched Lotion  Nivea Body Original Lotion  Nivea Body Sheer Moisturizing Lotion Nivea Crme  Nivea Skin Firming Lotion  NutraDerm 30 Skin Lotion  NutraDerm Skin Lotion  NutraDerm Therapeutic Skin Cream  NutraDerm Therapeutic Skin Lotion  ProShield Protective Hand Cream  Provon moisturizing lotion  Please read over the following fact sheets that you were given.

## 2023-03-18 ENCOUNTER — Other Ambulatory Visit: Payer: Self-pay

## 2023-03-18 ENCOUNTER — Encounter (HOSPITAL_COMMUNITY): Payer: Self-pay

## 2023-03-18 ENCOUNTER — Encounter (HOSPITAL_COMMUNITY)
Admission: RE | Admit: 2023-03-18 | Discharge: 2023-03-18 | Disposition: A | Discharge status: Home / Self Care | Payer: Medicare Other | Source: Ambulatory Visit | Attending: Orthopedic Surgery | Admitting: Orthopedic Surgery

## 2023-03-18 VITALS — BP 134/68 | HR 72 | Temp 97.8°F | Resp 16 | Ht 64.0 in | Wt 145.0 lb

## 2023-03-18 DIAGNOSIS — M4316 Spondylolisthesis, lumbar region: Secondary | ICD-10-CM | POA: Insufficient documentation

## 2023-03-18 DIAGNOSIS — E785 Hyperlipidemia, unspecified: Secondary | ICD-10-CM | POA: Insufficient documentation

## 2023-03-18 DIAGNOSIS — Z01818 Encounter for other preprocedural examination: Secondary | ICD-10-CM

## 2023-03-18 DIAGNOSIS — Z8673 Personal history of transient ischemic attack (TIA), and cerebral infarction without residual deficits: Secondary | ICD-10-CM | POA: Diagnosis not present

## 2023-03-18 DIAGNOSIS — I2510 Atherosclerotic heart disease of native coronary artery without angina pectoris: Secondary | ICD-10-CM | POA: Insufficient documentation

## 2023-03-18 DIAGNOSIS — Z01812 Encounter for preprocedural laboratory examination: Secondary | ICD-10-CM | POA: Diagnosis present

## 2023-03-18 HISTORY — DX: Supraventricular tachycardia, unspecified: I47.10

## 2023-03-18 HISTORY — DX: Cerebral infarction, unspecified: I63.9

## 2023-03-18 HISTORY — DX: Unspecified osteoarthritis, unspecified site: M19.90

## 2023-03-18 LAB — CBC
HCT: 38.4 % (ref 36.0–46.0)
Hemoglobin: 12.9 g/dL (ref 12.0–15.0)
MCH: 28.7 pg (ref 26.0–34.0)
MCHC: 33.6 g/dL (ref 30.0–36.0)
MCV: 85.5 fL (ref 80.0–100.0)
Platelets: 286 10*3/uL (ref 150–400)
RBC: 4.49 MIL/uL (ref 3.87–5.11)
RDW: 11.6 % (ref 11.5–15.5)
WBC: 3.4 10*3/uL — ABNORMAL LOW (ref 4.0–10.5)
nRBC: 0 % (ref 0.0–0.2)

## 2023-03-18 LAB — BASIC METABOLIC PANEL
Anion gap: 9 (ref 5–15)
BUN: 10 mg/dL (ref 8–23)
CO2: 26 mmol/L (ref 22–32)
Calcium: 9.3 mg/dL (ref 8.9–10.3)
Chloride: 106 mmol/L (ref 98–111)
Creatinine, Ser: 0.8 mg/dL (ref 0.44–1.00)
GFR, Estimated: >60 mL/min (ref >60)
Glucose, Bld: 98 mg/dL (ref 70–99)
Potassium: 3.8 mmol/L (ref 3.5–5.1)
Sodium: 141 mmol/L (ref 135–145)

## 2023-03-18 LAB — TYPE AND SCREEN
ABO/RH(D): O POS
Antibody Screen: NEGATIVE

## 2023-03-18 LAB — SURGICAL PCR SCREEN
MRSA, PCR: NEGATIVE
Staphylococcus aureus: NEGATIVE

## 2023-03-18 NOTE — Progress Notes (Signed)
PCP - Dr. Andi Devon Cardiologist - denies EP- Dr. Steffanie Dunn  PPM/ICD - denies   Chest x-ray - 06/04/21 EKG - 09/28/22 Stress Test - 02/2006 ECHO - 03/06/21 Cardiac Cath - denies  Sleep Study - denies   DM- denies  Last dose of GLP1 agonist-  n/a   Blood Thinner Instructions: n/a Aspirin Instructions: f/u with surgeon  ERAS Protcol - yes, no drink   COVID TEST- n/a   Anesthesia review: yes, cardiac hx  Patient denies shortness of breath, fever, cough and chest pain at PAT appointment   All instructions explained to the patient, with a verbal understanding of the material. Patient agrees to go over the instructions while at home for a better understanding. The opportunity to ask questions was provided.

## 2023-03-19 NOTE — Progress Notes (Signed)
Anesthesia Chart Review:  Case: [id number] Date/Time: 03/25/23 0715   Procedure: TRANSFORAMINAL LUMBAR INTERBODY FUSION (TLIF) WITH PEDICLE SCREW FIXATION 1 LEVEL  L4-L5 - 240   Anesthesia type: General   Pre-op diagnosis: Grade 1 Spondylolisthesis with Radculopathy L4-L5   [facility] ROOM 04 / [facility]   Surgeons: Venita Lick, MD       DISCUSSION: Patient is a 68 year old female scheduled for the above procedure.  History includes never smoker, HLD, cryptogenic stroke (03/06/21; s/p MDT LINQ II implanted 03/07/21), SVT/PVCs (on Toprol), Sarcoidosis (diagnosed 2008 with bronchoscopy), migraines, vertigo.  Coronary calcium score of 0 on 06/10/21.   Loop recorder EP follow-up on 12/29/22 with Francis Dowse, PA-C. She was doing well. Tolerating Toprol (which have been started for SVT and some PVCs noted during previous device interrogation).  Denied palpitations, dyspnea, and chest pain.  No syncope or near syncope. ILR interrogation showed good battery status, R wave 0.29, no events/arrhythmias, no A-fib, 5% PVCs. Six month follow-up planned.   In December 2024, PCP Andi Devon, MD cleared patient for lumbar surgery with recommendations to "continue current meds as directed."  Anesthesia team to evaluate on the day of surgery.    VS: BP 134/68   Pulse 72   Temp 36.6 C   Resp 16   Ht 5\' 4"  (1.626 m)   Wt 65.8 kg   SpO2 98%   BMI 24.89 kg/m   PROVIDERS: Andi Devon, MD is PCP  Steffanie Dunn, MD is EP cardiologist (for [employer] LINQ II Loop recorder 03/07/21) Delia Heady, MD is neurologist   LABS: Labs reviewed: Acceptable for surgery. (all labs ordered are listed, but only abnormal results are displayed)  Labs Reviewed  CBC - Abnormal; Notable for the following components:      Result Value   WBC 3.4 (*)    All other components within normal limits  SURGICAL PCR SCREEN  BASIC METABOLIC PANEL  TYPE AND SCREEN     IMAGES: CT Chest (over read Coronary Ca  Scoring; ordered by Andi Devon, MD) 06/10/21: IMPRESSION: 1. There are multiple partially solid and partially ground-glass nodules within the partially visualized bilateral lungs with the largest solid component measuring approximately 5 mm within the left upper lobe. Reportedly the patient has a history of sarcoidosis, and a report from 07/15/2006 CT describes right upper lobe biopsy postsurgical changes and scattered lung nodules. No prior CT chest imaging is available for comparison. Recommend comparison to more recent CT of the chest to assess stability of these lung nodules. If no priors are available for comparison, consider a new CT of the chest to establish a baseline for surveillance of these pulmonary nodules. 2. There is a soft tissue nodular density measuring up to approximately 2 cm within the posterior right breast. Recommend correlation with patient's mammograms. Note is made on the 03/17/2006 remote chest CT report description of a deep right retroareolar right breast density on 03/17/2006 CT measuring up to 2.4 cm. If the patient has not had a mammographic evaluation within the past year, consider mammography for further evaluation. 3. There are low-density lesions seen within the visualized superior aspect of the liver. Those that are large enough to characterize demonstrate fluid density indicating cysts. Numerous low-density lesions were also described on 03/17/2006 CT report with those large enough to characterize demonstrating fluid density of cysts.   EKG: 09/28/22 ([facility]): Sinus rhythm with Premature atrial complexes Right bundle branch block When compared with ECG of 04-Jun-2021 01:12, PREVIOUS ECG IS  PRESENT Confirmed by Francis Dowse ([id number]) on 09/28/2022 8:46:54 AM   CV: CT Coronary Calcium Scoring 06/10/21: IMPRESSION: Coronary calcium score of 0.   Echo 03/06/21: 1. Left ventricular ejection fraction, by estimation, is 55 to 60%. The   left ventricle has normal function. The left ventricle has no regional  wall motion abnormalities. Left ventricular diastolic parameters are  consistent with Grade I diastolic  dysfunction (impaired relaxation).   2. Right ventricular systolic function is normal. The right ventricular  size is normal.   3. The mitral valve is normal in structure. Trivial mitral valve  regurgitation. No evidence of mitral stenosis.   4. The aortic valve is tricuspid. Aortic valve regurgitation is mild. No  aortic stenosis is present.   5. The inferior vena cava is normal in size with greater than 50%  respiratory variability, suggesting right atrial pressure of 3 mmHg.  - Comparison(s): No prior Echocardiogram.   US Carotid 03/06/21: Summary:  - Right Carotid: The extracranial vessels were near-normal with only minimal  wall thickening or plaque.  - Left Carotid: The extracranial vessels were near-normal with only minimal  wall thickening or plaque.  - Vertebrals:  Bilateral vertebral arteries demonstrate antegrade flow.  - Subclavians: Normal flow hemodynamics were seen in bilateral subclavian arteries.    Past Medical History:  Diagnosis Date   Arthritis    Dizziness    Dizziness and giddiness    Hyperlipidemia    Migraine    Sarcoidosis    pt states she is "unable to lay flat"   Stress incontinence    Stroke [facility])    SVT (supraventricular tachycardia) (HCC)    Vertigo     Past Surgical History:  Procedure Laterality Date   BLADDER SUSPENSION N/A 12/07/2019   Procedure: TRANSVAGINAL TAPE (TVT) PROCEDURE;  Surgeon: Osborn Coho, MD;  Location: [facility] OR;  Service: Gynecology;  Laterality: N/A;   CYSTOSCOPY N/A 12/07/2019   Procedure: CYSTOSCOPY;  Surgeon: Osborn Coho, MD;  Location: [facility] OR;  Service: Gynecology;  Laterality: N/A;   LOOP RECORDER INSERTION N/A 03/07/2021   Procedure: LOOP RECORDER INSERTION;  Surgeon: Lanier Prude, MD;  Location: [facility] INVASIVE CV LAB;  Service:  Cardiovascular;  Laterality: N/A;    MEDICATIONS:  Ascorbic Acid (VITA-C PO)   aspirin EC 81 MG tablet   B Complex-C (SUPER B COMPLEX PO)   cetirizine (ZYRTEC) 10 MG tablet   cholecalciferol (VITAMIN D3) 25 MCG (1000 UNIT) tablet   Docusate Sodium 100 MG capsule   donepezil (ARICEPT) 5 MG tablet   fluticasone (FLONASE) 50 MCG/ACT nasal spray   HYDROcodone-acetaminophen (NORCO/VICODIN) 5-325 MG tablet   Magnesium Oxide (MAG-OX PO)   meclizine (ANTIVERT) 25 MG tablet   meloxicam (MOBIC) 15 MG tablet   metoprolol tartrate (LOPRESSOR) 25 MG tablet   mirabegron ER (MYRBETRIQ) 25 MG TB24 tablet   Omega-3 Fatty Acids (FISH OIL) 1000 MG CAPS   ondansetron (ZOFRAN) 8 MG tablet   ondansetron (ZOFRAN-ODT) 8 MG disintegrating tablet   oxymetazoline (AFRIN) 0.05 % nasal spray   phentermine 37.5 MG capsule   Probiotic Product (PROBIOTIC GUMMIES PO)   No current facility-administered medications for this encounter.  She was advised to follow surgeon recommendations regarding perioperative ASA instructions. Advised at 03/18/23 PAT visit to hold phentermine until after surgery.    Shonna Chock, PA-C Surgical Short Stay/Anesthesiology [facility] Phone [phone] [facility] Phone [phone] 03/19/2023 4:16 PM

## 2023-03-19 NOTE — Anesthesia Preprocedure Evaluation (Signed)
Anesthesia Evaluation    Airway        Dental   Pulmonary           Cardiovascular      Neuro/Psych    GI/Hepatic   Endo/Other    Renal/GU      Musculoskeletal   Abdominal   Peds  Hematology   Anesthesia Other Findings   Reproductive/Obstetrics                             Anesthesia Physical Anesthesia Plan  ASA:   Anesthesia Plan:    Post-op Pain Management:    Induction:   PONV Risk Score and Plan:   Airway Management Planned:   Additional Equipment:   Intra-op Plan:   Post-operative Plan:   Informed Consent:   Plan Discussed with:   Anesthesia Plan Comments: (PAT note written 03/19/2023 by Shonna Chock, PA-C.  )       Anesthesia Quick Evaluation

## 2023-03-23 ENCOUNTER — Other Ambulatory Visit: Payer: Self-pay | Admitting: Internal Medicine

## 2023-03-23 DIAGNOSIS — E2839 Other primary ovarian failure: Secondary | ICD-10-CM

## 2023-03-25 ENCOUNTER — Encounter (HOSPITAL_COMMUNITY)
Admission: RE | Disposition: A | Discharge status: Home / Self Care | Payer: Self-pay | Source: Home / Self Care | Attending: Orthopedic Surgery

## 2023-03-25 ENCOUNTER — Inpatient Hospital Stay (HOSPITAL_COMMUNITY): Payer: Medicare Other | Admitting: Anesthesiology

## 2023-03-25 ENCOUNTER — Encounter (HOSPITAL_COMMUNITY): Payer: Self-pay | Admitting: Orthopedic Surgery

## 2023-03-25 ENCOUNTER — Other Ambulatory Visit: Payer: Self-pay

## 2023-03-25 ENCOUNTER — Inpatient Hospital Stay (HOSPITAL_COMMUNITY): Payer: Medicare Other

## 2023-03-25 ENCOUNTER — Ambulatory Visit (INDEPENDENT_AMBULATORY_CARE_PROVIDER_SITE_OTHER): Payer: Medicare Other

## 2023-03-25 ENCOUNTER — Inpatient Hospital Stay (HOSPITAL_COMMUNITY)
Admission: RE | Admit: 2023-03-25 | Discharge: 2023-03-27 | DRG: 402 | Disposition: A | Discharge status: Home / Self Care | Payer: Medicare Other | Attending: Orthopedic Surgery | Admitting: Orthopedic Surgery

## 2023-03-25 ENCOUNTER — Inpatient Hospital Stay (HOSPITAL_COMMUNITY): Payer: Medicare Other | Admitting: Vascular Surgery

## 2023-03-25 DIAGNOSIS — M5116 Intervertebral disc disorders with radiculopathy, lumbar region: Secondary | ICD-10-CM | POA: Diagnosis not present

## 2023-03-25 DIAGNOSIS — Z7982 Long term (current) use of aspirin: Secondary | ICD-10-CM | POA: Diagnosis not present

## 2023-03-25 DIAGNOSIS — E785 Hyperlipidemia, unspecified: Secondary | ICD-10-CM | POA: Diagnosis present

## 2023-03-25 DIAGNOSIS — M4316 Spondylolisthesis, lumbar region: Secondary | ICD-10-CM | POA: Diagnosis present

## 2023-03-25 DIAGNOSIS — Z79899 Other long term (current) drug therapy: Secondary | ICD-10-CM | POA: Diagnosis not present

## 2023-03-25 DIAGNOSIS — I679 Cerebrovascular disease, unspecified: Secondary | ICD-10-CM | POA: Diagnosis not present

## 2023-03-25 DIAGNOSIS — M5416 Radiculopathy, lumbar region: Secondary | ICD-10-CM | POA: Diagnosis present

## 2023-03-25 DIAGNOSIS — M4317 Spondylolisthesis, lumbosacral region: Secondary | ICD-10-CM | POA: Diagnosis present

## 2023-03-25 DIAGNOSIS — I639 Cerebral infarction, unspecified: Secondary | ICD-10-CM | POA: Diagnosis not present

## 2023-03-25 DIAGNOSIS — Z8673 Personal history of transient ischemic attack (TIA), and cerebral infarction without residual deficits: Secondary | ICD-10-CM | POA: Diagnosis not present

## 2023-03-25 DIAGNOSIS — Z981 Arthrodesis status: Principal | ICD-10-CM

## 2023-03-25 HISTORY — PX: TRANSFORAMINAL LUMBAR INTERBODY FUSION (TLIF) WITH PEDICLE SCREW FIXATION 1 LEVEL: SHX6141

## 2023-03-25 SURGERY — TRANSFORAMINAL LUMBAR INTERBODY FUSION (TLIF) WITH PEDICLE SCREW FIXATION 1 LEVEL
Anesthesia: General

## 2023-03-25 MED ORDER — MAGNESIUM CITRATE PO SOLN
1.0000 | Freq: Once | ORAL | Status: DC | PRN
  Filled 2023-03-25 (×2): qty 296

## 2023-03-25 MED ORDER — THROMBIN 20000 UNITS EX KIT
PACK | CUTANEOUS | Status: AC
  Filled 2023-03-25: qty 1

## 2023-03-25 MED ORDER — ACETAMINOPHEN 10 MG/ML IV SOLN: 1000.0000 mg | Freq: Once | INTRAVENOUS | Status: DC | PRN

## 2023-03-25 MED ORDER — ONDANSETRON HCL 4 MG/2ML IJ SOLN
INTRAMUSCULAR | Status: DC | PRN
  Administered 2023-03-25: 4 mg via INTRAVENOUS

## 2023-03-25 MED ORDER — OXYCODONE-ACETAMINOPHEN 10-325 MG PO TABS: 1.0000 | ORAL_TABLET | Freq: Four times a day (QID) | ORAL | 0 refills | Status: DC | PRN

## 2023-03-25 MED ORDER — SURGIFLO WITH THROMBIN (HEMOSTATIC MATRIX KIT) OPTIME
TOPICAL | Status: DC | PRN
  Administered 2023-03-25 (×2): 1 via TOPICAL

## 2023-03-25 MED ORDER — OXYMETAZOLINE HCL 0.05 % NA SOLN
1.0000 | Freq: Every day | NASAL | Status: DC | PRN
  Filled 2023-03-25: qty 30

## 2023-03-25 MED ORDER — CEFAZOLIN SODIUM-DEXTROSE 1-4 GM/50ML-% IV SOLN
1.0000 g | Freq: Three times a day (TID) | INTRAVENOUS | Status: AC
  Administered 2023-03-25 (×2): 1 g via INTRAVENOUS
  Filled 2023-03-25 (×2): qty 50

## 2023-03-25 MED ORDER — POLYETHYLENE GLYCOL 3350 17 G PO PACK: 17.0000 g | PACK | Freq: Every day | ORAL | Status: DC | PRN

## 2023-03-25 MED ORDER — FLUTICASONE PROPIONATE 50 MCG/ACT NA SUSP
1.0000 | Freq: Every day | NASAL | Status: DC | PRN
  Filled 2023-03-25: qty 16

## 2023-03-25 MED ORDER — PHENYLEPHRINE 80 MCG/ML (10ML) SYRINGE FOR IV PUSH (FOR BLOOD PRESSURE SUPPORT)
PREFILLED_SYRINGE | INTRAVENOUS | Status: DC | PRN
  Administered 2023-03-25: 120 ug via INTRAVENOUS
  Administered 2023-03-25: 80 ug via INTRAVENOUS
  Administered 2023-03-25: 160 ug via INTRAVENOUS
  Administered 2023-03-25: 80 ug via INTRAVENOUS
  Administered 2023-03-25: 120 ug via INTRAVENOUS

## 2023-03-25 MED ORDER — PHENYLEPHRINE HCL-NACL 20-0.9 MG/250ML-% IV SOLN
INTRAVENOUS | Status: DC | PRN
  Administered 2023-03-25: 40 ug/min via INTRAVENOUS

## 2023-03-25 MED ORDER — ONDANSETRON HCL 4 MG PO TABS
4.0000 mg | ORAL_TABLET | Freq: Four times a day (QID) | ORAL | Status: DC | PRN
  Administered 2023-03-26: 4 mg via ORAL
  Filled 2023-03-25: qty 1

## 2023-03-25 MED ORDER — HYDROMORPHONE HCL 1 MG/ML IJ SOLN
INTRAMUSCULAR | Status: DC | PRN
  Administered 2023-03-25: .5 mg via INTRAVENOUS

## 2023-03-25 MED ORDER — HYDROMORPHONE HCL 1 MG/ML IJ SOLN
INTRAMUSCULAR | Status: AC
  Filled 2023-03-25: qty 0.5

## 2023-03-25 MED ORDER — PROPOFOL 10 MG/ML IV BOLUS
INTRAVENOUS | Status: DC | PRN
  Administered 2023-03-25: 100 mg via INTRAVENOUS
  Administered 2023-03-25: 50 mg via INTRAVENOUS

## 2023-03-25 MED ORDER — OXYCODONE HCL 5 MG PO TABS: 5.0000 mg | ORAL_TABLET | Freq: Once | ORAL | Status: DC | PRN

## 2023-03-25 MED ORDER — LIDOCAINE 2% (20 MG/ML) 5 ML SYRINGE
INTRAMUSCULAR | Status: DC | PRN
  Administered 2023-03-25: 3 mL via INTRAVENOUS

## 2023-03-25 MED ORDER — OXYCODONE HCL 5 MG PO TABS: 10.0000 mg | ORAL_TABLET | ORAL | Status: DC | PRN

## 2023-03-25 MED ORDER — PHENOL 1.4 % MT LIQD: 1.0000 | OROMUCOSAL | Status: DC | PRN

## 2023-03-25 MED ORDER — HYDROMORPHONE HCL 1 MG/ML IJ SOLN: 1.0000 mg | INTRAMUSCULAR | Status: AC | PRN

## 2023-03-25 MED ORDER — DONEPEZIL HCL 5 MG PO TABS
5.0000 mg | ORAL_TABLET | Freq: Every day | ORAL | Status: DC
  Administered 2023-03-25 – 2023-03-26 (×2): 5 mg via ORAL
  Filled 2023-03-25 (×2): qty 1

## 2023-03-25 MED ORDER — 0.9 % SODIUM CHLORIDE (POUR BTL) OPTIME
TOPICAL | Status: DC | PRN
  Administered 2023-03-25 (×2): 1000 mL

## 2023-03-25 MED ORDER — MECLIZINE HCL 25 MG PO TABS
25.0000 mg | ORAL_TABLET | Freq: Three times a day (TID) | ORAL | Status: DC | PRN
  Administered 2023-03-25: 25 mg via ORAL
  Filled 2023-03-25 (×4): qty 1

## 2023-03-25 MED ORDER — FENTANYL CITRATE (PF) 250 MCG/5ML IJ SOLN
INTRAMUSCULAR | Status: DC | PRN
  Administered 2023-03-25 (×4): 50 ug via INTRAVENOUS
  Administered 2023-03-25: 100 ug via INTRAVENOUS

## 2023-03-25 MED ORDER — PROPOFOL 10 MG/ML IV BOLUS
INTRAVENOUS | Status: AC
  Filled 2023-03-25: qty 20

## 2023-03-25 MED ORDER — FENTANYL CITRATE (PF) 250 MCG/5ML IJ SOLN
INTRAMUSCULAR | Status: AC
  Filled 2023-03-25: qty 5

## 2023-03-25 MED ORDER — PHENYLEPHRINE HCL (PRESSORS) 10 MG/ML IV SOLN
INTRAVENOUS | Status: AC
  Filled 2023-03-25: qty 1

## 2023-03-25 MED ORDER — ONDANSETRON HCL 4 MG PO TABS: 4.0000 mg | ORAL_TABLET | Freq: Three times a day (TID) | ORAL | 0 refills | Status: AC | PRN

## 2023-03-25 MED ORDER — DEXAMETHASONE SODIUM PHOSPHATE 10 MG/ML IJ SOLN
INTRAMUSCULAR | Status: DC | PRN
  Administered 2023-03-25: 10 mg via INTRAVENOUS

## 2023-03-25 MED ORDER — SENNOSIDES-DOCUSATE SODIUM 8.6-50 MG PO TABS
1.0000 | ORAL_TABLET | Freq: Two times a day (BID) | ORAL | Status: DC
  Administered 2023-03-25 – 2023-03-26 (×3): 1 via ORAL
  Filled 2023-03-25 (×3): qty 1

## 2023-03-25 MED ORDER — CHLORHEXIDINE GLUCONATE 0.12 % MT SOLN
15.0000 mL | Freq: Once | OROMUCOSAL | Status: AC
  Administered 2023-03-25: 15 mL via OROMUCOSAL
  Filled 2023-03-25: qty 15

## 2023-03-25 MED ORDER — ORAL CARE MOUTH RINSE: 15.0000 mL | Freq: Once | OROMUCOSAL | Status: AC

## 2023-03-25 MED ORDER — TRANEXAMIC ACID-NACL 1000-0.7 MG/100ML-% IV SOLN
1000.0000 mg | INTRAVENOUS | Status: AC
  Administered 2023-03-25: 1000 mg via INTRAVENOUS
  Filled 2023-03-25: qty 100

## 2023-03-25 MED ORDER — SODIUM CHLORIDE 0.9% FLUSH: 3.0000 mL | INTRAVENOUS | Status: DC | PRN

## 2023-03-25 MED ORDER — LACTATED RINGERS IV SOLN: INTRAVENOUS | Status: AC

## 2023-03-25 MED ORDER — METHOCARBAMOL 1000 MG/10ML IJ SOLN: 500.0000 mg | Freq: Four times a day (QID) | INTRAMUSCULAR | Status: DC | PRN

## 2023-03-25 MED ORDER — OXYCODONE HCL 5 MG PO TABS
5.0000 mg | ORAL_TABLET | ORAL | Status: DC | PRN
  Filled 2023-03-25: qty 1

## 2023-03-25 MED ORDER — LACTATED RINGERS IV SOLN: INTRAVENOUS | Status: DC

## 2023-03-25 MED ORDER — OXYCODONE HCL 5 MG/5ML PO SOLN: 5.0000 mg | Freq: Once | ORAL | Status: DC | PRN

## 2023-03-25 MED ORDER — HYDROCODONE-ACETAMINOPHEN 5-325 MG PO TABS
1.0000 | ORAL_TABLET | ORAL | Status: DC | PRN
  Administered 2023-03-25: 1 via ORAL
  Administered 2023-03-26 – 2023-03-27 (×5): 2 via ORAL
  Filled 2023-03-25: qty 1
  Filled 2023-03-25 (×5): qty 2

## 2023-03-25 MED ORDER — SUCCINYLCHOLINE CHLORIDE 200 MG/10ML IV SOSY
PREFILLED_SYRINGE | INTRAVENOUS | Status: DC | PRN
  Administered 2023-03-25: 30 mg via INTRAVENOUS

## 2023-03-25 MED ORDER — ACETAMINOPHEN 325 MG PO TABS
650.0000 mg | ORAL_TABLET | ORAL | Status: DC | PRN
Start: 2023-03-25 — End: 2023-03-27

## 2023-03-25 MED ORDER — ACETAMINOPHEN 650 MG RE SUPP: 650.0000 mg | RECTAL | Status: DC | PRN

## 2023-03-25 MED ORDER — METOPROLOL TARTRATE 25 MG PO TABS
25.0000 mg | ORAL_TABLET | Freq: Two times a day (BID) | ORAL | Status: DC
  Administered 2023-03-25 – 2023-03-26 (×2): 25 mg via ORAL
  Filled 2023-03-25 (×2): qty 1

## 2023-03-25 MED ORDER — HYDROMORPHONE HCL 1 MG/ML IJ SOLN
INTRAMUSCULAR | Status: AC
  Filled 2023-03-25: qty 1

## 2023-03-25 MED ORDER — MIDAZOLAM HCL 2 MG/2ML IJ SOLN
INTRAMUSCULAR | Status: DC | PRN
  Administered 2023-03-25: 2 mg via INTRAVENOUS

## 2023-03-25 MED ORDER — METHOCARBAMOL 500 MG PO TABS: 500.0000 mg | ORAL_TABLET | Freq: Three times a day (TID) | ORAL | 0 refills | Status: AC | PRN

## 2023-03-25 MED ORDER — ONDANSETRON HCL 4 MG/2ML IJ SOLN
4.0000 mg | Freq: Four times a day (QID) | INTRAMUSCULAR | Status: DC | PRN
  Administered 2023-03-25: 4 mg via INTRAVENOUS
  Filled 2023-03-25: qty 2

## 2023-03-25 MED ORDER — CEFAZOLIN SODIUM-DEXTROSE 2-4 GM/100ML-% IV SOLN
2.0000 g | INTRAVENOUS | Status: AC
  Administered 2023-03-25: 2 g via INTRAVENOUS
  Filled 2023-03-25: qty 100

## 2023-03-25 MED ORDER — MIDAZOLAM HCL 2 MG/2ML IJ SOLN
INTRAMUSCULAR | Status: AC
  Filled 2023-03-25: qty 2

## 2023-03-25 MED ORDER — ALBUMIN HUMAN 5 % IV SOLN: INTRAVENOUS | Status: DC | PRN

## 2023-03-25 MED ORDER — HYDROMORPHONE HCL 1 MG/ML IJ SOLN
0.2500 mg | INTRAMUSCULAR | Status: DC | PRN
  Administered 2023-03-25 (×2): 0.5 mg via INTRAVENOUS

## 2023-03-25 MED ORDER — METHOCARBAMOL 500 MG PO TABS
500.0000 mg | ORAL_TABLET | Freq: Four times a day (QID) | ORAL | Status: DC | PRN
Start: 2023-03-25 — End: 2023-03-27
  Administered 2023-03-25 – 2023-03-27 (×4): 500 mg via ORAL
  Filled 2023-03-25 (×4): qty 1

## 2023-03-25 MED ORDER — MIRABEGRON ER 25 MG PO TB24
25.0000 mg | ORAL_TABLET | Freq: Every day | ORAL | Status: DC
  Administered 2023-03-26: 25 mg via ORAL
  Filled 2023-03-25: qty 1

## 2023-03-25 MED ORDER — BUPIVACAINE-EPINEPHRINE (PF) 0.25% -1:200000 IJ SOLN
INTRAMUSCULAR | Status: AC
  Filled 2023-03-25: qty 30

## 2023-03-25 MED ORDER — BUPIVACAINE-EPINEPHRINE 0.25% -1:200000 IJ SOLN
INTRAMUSCULAR | Status: DC | PRN
  Administered 2023-03-25: 10 mL

## 2023-03-25 MED ORDER — ONDANSETRON HCL 4 MG/2ML IJ SOLN: 4.0000 mg | Freq: Once | INTRAMUSCULAR | Status: DC | PRN

## 2023-03-25 MED ORDER — PHENTERMINE HCL 37.5 MG PO CAPS: 37.5000 mg | ORAL_CAPSULE | Freq: Every day | ORAL | Status: DC

## 2023-03-25 MED ORDER — KETAMINE HCL 50 MG/5ML IJ SOSY
PREFILLED_SYRINGE | INTRAMUSCULAR | Status: AC
  Filled 2023-03-25: qty 5

## 2023-03-25 MED ORDER — THROMBIN 20000 UNITS EX SOLR
CUTANEOUS | Status: DC | PRN
  Administered 2023-03-25: 20000 [IU] via TOPICAL

## 2023-03-25 MED ORDER — DEXAMETHASONE SODIUM PHOSPHATE 4 MG/ML IJ SOLN
4.0000 mg | Freq: Four times a day (QID) | INTRAMUSCULAR | Status: AC
  Administered 2023-03-25: 4 mg via INTRAVENOUS
  Filled 2023-03-25: qty 1

## 2023-03-25 MED ORDER — KETAMINE HCL 10 MG/ML IJ SOLN
INTRAMUSCULAR | Status: DC | PRN
  Administered 2023-03-25: 15 mg via INTRAVENOUS

## 2023-03-25 MED ORDER — SODIUM CHLORIDE 0.9 % IV SOLN: 250.0000 mL | INTRAVENOUS | Status: AC

## 2023-03-25 MED ORDER — METOPROLOL TARTRATE 5 MG/5ML IV SOLN
INTRAVENOUS | Status: DC | PRN
  Administered 2023-03-25: 2.5 mg via INTRAVENOUS

## 2023-03-25 MED ORDER — SODIUM CHLORIDE 0.9% FLUSH
3.0000 mL | Freq: Two times a day (BID) | INTRAVENOUS | Status: DC
  Administered 2023-03-25 (×2): 3 mL via INTRAVENOUS

## 2023-03-25 MED ORDER — DEXAMETHASONE 4 MG PO TABS
4.0000 mg | ORAL_TABLET | Freq: Four times a day (QID) | ORAL | Status: AC
  Administered 2023-03-25 – 2023-03-26 (×2): 4 mg via ORAL
  Filled 2023-03-25 (×2): qty 1

## 2023-03-25 MED ORDER — MENTHOL 3 MG MT LOZG: 1.0000 | LOZENGE | OROMUCOSAL | Status: DC | PRN

## 2023-03-25 SURGICAL SUPPLY — 68 items
BAG COUNTER SPONGE SURGICOUNT (BAG) ×2 IMPLANT
BLADE CLIPPER SURG (BLADE) IMPLANT
BUR MATCHSTICK NEURO 3.0 LAGG (BURR) ×2 IMPLANT
BUR SABER RD CUTTING 3.0 (BURR) IMPLANT
BURR SURG PEAR FLUTED 5.0 (BURR) IMPLANT
CAGE MOD EX PL 7X9X24 17D (Cage) IMPLANT
CANISTER SUCT 3000ML PPV (MISCELLANEOUS) ×2 IMPLANT
CAP RELINE MOD TULIP RMM (Cap) IMPLANT
CLSR STERI-STRIP ANTIMIC 1/2X4 (GAUZE/BANDAGES/DRESSINGS) ×2 IMPLANT
COVER SURGICAL LIGHT HANDLE (MISCELLANEOUS) ×2 IMPLANT
DRAIN CHANNEL 15F RND FF W/TCR (WOUND CARE) IMPLANT
DRAPE C-ARM 42X72 X-RAY (DRAPES) ×2 IMPLANT
DRAPE C-ARMOR (DRAPES) ×2 IMPLANT
DRAPE POUCH INSTRU U-SHP 10X18 (DRAPES) IMPLANT
DRAPE SURG 17X23 STRL (DRAPES) ×2 IMPLANT
DRAPE U-SHAPE 47X51 STRL (DRAPES) ×2 IMPLANT
DRSG OPSITE POSTOP 4X8 (GAUZE/BANDAGES/DRESSINGS) ×2 IMPLANT
DURAPREP 26ML APPLICATOR (WOUND CARE) ×2 IMPLANT
ELECT BLADE 6.5 EXT (BLADE) ×2 IMPLANT
ELECT NVM5 SURFACE MEP/EMG (ELECTRODE) IMPLANT
ELECT PENCIL ROCKER SW 15FT (MISCELLANEOUS) ×2 IMPLANT
ELECT REM PT RETURN 9FT ADLT (ELECTROSURGICAL) ×1 IMPLANT
ELECTRODE REM PT RTRN 9FT ADLT (ELECTROSURGICAL) ×2 IMPLANT
GLOVE BIOGEL PI IND STRL 8.5 (GLOVE) ×2 IMPLANT
GLOVE SS BIOGEL STRL SZ 8.5 (GLOVE) ×2 IMPLANT
GOWN STRL REUS W/TWL 2XL LVL3 (GOWN DISPOSABLE) ×2 IMPLANT
KIT BASIN OR (CUSTOM PROCEDURE TRAY) ×2 IMPLANT
KIT POSITION SURG JACKSON T1 (MISCELLANEOUS) IMPLANT
KIT TURNOVER KIT B (KITS) ×2 IMPLANT
MODULE EMG NDL SSEP NVM5 (NEUROSURGERY SUPPLIES) IMPLANT
MODULE EMG NEEDLE SSEP NVM5 (NEUROSURGERY SUPPLIES) ×1 IMPLANT
NDL 22X1.5 STRL (OR ONLY) (MISCELLANEOUS) IMPLANT
NDL SPNL 18GX3.5 QUINCKE PK (NEEDLE) ×2 IMPLANT
NEEDLE 22X1.5 STRL (OR ONLY) (MISCELLANEOUS) IMPLANT
NEEDLE SPNL 18GX3.5 QUINCKE PK (NEEDLE) ×1 IMPLANT
NS IRRIG 1000ML POUR BTL (IV SOLUTION) ×2 IMPLANT
PACK LAMINECTOMY ORTHO (CUSTOM PROCEDURE TRAY) ×2 IMPLANT
PACK UNIVERSAL I (CUSTOM PROCEDURE TRAY) ×2 IMPLANT
PAD ARMBOARD 7.5X6 YLW CONV (MISCELLANEOUS) ×4 IMPLANT
PATTIES SURGICAL .5 X.5 (GAUZE/BANDAGES/DRESSINGS) IMPLANT
PATTIES SURGICAL .5 X1 (DISPOSABLE) ×2 IMPLANT
POSITIONER HEAD PRONE TRACH (MISCELLANEOUS) ×2 IMPLANT
PROBE BALL TIP NVM5 SNG USE (NEUROSURGERY SUPPLIES) IMPLANT
PUTTY DBM INSTAFILL CART 5CC (Putty) IMPLANT
ROD RELINE COCR LORD 5X40MM (Rod) IMPLANT
SCREW LOCK RSS 4.5/5.0MM (Screw) IMPLANT
SCREW SHANK RELINE MOD 6.5X35 (Screw) IMPLANT
SHANK RELINE MOD 5.5X40 (Screw) IMPLANT
SPONGE SURGIFOAM ABS GEL 100 (HEMOSTASIS) ×2 IMPLANT
SPONGE T-LAP 4X18 ~~LOC~~+RFID (SPONGE) ×6 IMPLANT
SURGIFLO W/THROMBIN 8M KIT (HEMOSTASIS) ×2 IMPLANT
SUT BONE WAX W31G (SUTURE) ×2 IMPLANT
SUT MNCRL AB 3-0 PS2 27 (SUTURE) ×4 IMPLANT
SUT MNCRL+ AB 3-0 CT1 36 (SUTURE) ×2 IMPLANT
SUT STRATAFIX 1PDS 45CM VIOLET (SUTURE) IMPLANT
SUT VIC AB 0 CT1 27XBRD ANTBC (SUTURE) ×2 IMPLANT
SUT VIC AB 1 CT1 18XCR BRD 8 (SUTURE) ×2 IMPLANT
SUT VIC AB 2-0 CT1 18 (SUTURE) ×2 IMPLANT
SYR BULB IRRIG 60ML STRL (SYRINGE) ×2 IMPLANT
SYR CONTROL 10ML LL (SYRINGE) ×2 IMPLANT
TAPE STRIPS DRAPE STRL (GAUZE/BANDAGES/DRESSINGS) IMPLANT
TIP CART INSTAFILL GL 5CC (ORTHOPEDIC DISPOSABLE SUPPLIES) IMPLANT
TIP CONICAL INSTAFILL (ORTHOPEDIC DISPOSABLE SUPPLIES) IMPLANT
TOWEL GREEN STERILE (TOWEL DISPOSABLE) ×2 IMPLANT
TOWEL GREEN STERILE FF (TOWEL DISPOSABLE) ×2 IMPLANT
TRAY FOLEY MTR SLVR 16FR STAT (SET/KITS/TRAYS/PACK) ×2 IMPLANT
WATER STERILE IRR 1000ML POUR (IV SOLUTION) ×2 IMPLANT
YANKAUER SUCT BULB TIP NO VENT (SUCTIONS) ×2 IMPLANT

## 2023-03-25 NOTE — H&P (Signed)
 History: The patient is a 68 year old female who presents for pre-operative visit in preparation for their TLIF L4-5, which is scheduled on 03/25/23 with Dr. Donaciano Sprang, MD at [facility]. The patient has had symptoms in the back and left leg including pain which has impacted their quality of life and ability to do activities of daily living. The patient currently has a diagnosis of lumbar radiculopathy and has failed conservative treatments including activity modification. The patient has had no previous surgery . The patient denies an active infection.  Past Medical History:  Diagnosis Date   Arthritis    Dizziness    Dizziness and giddiness    Hyperlipidemia    Migraine    Sarcoidosis    pt states she is unable to lay flat   Stress incontinence    Stroke [facility])    SVT (supraventricular tachycardia) (HCC)    Vertigo     No Known Allergies  No current facility-administered medications on file prior to encounter.   Current Outpatient Medications on File Prior to Encounter  Medication Sig Dispense Refill   Ascorbic Acid (VITA-C PO) Take 1,000 mg by mouth daily.     aspirin  EC 81 MG tablet Take 81 mg by mouth daily.     B Complex-C (SUPER B COMPLEX PO) Take 1 tablet by mouth daily.     cholecalciferol (VITAMIN D3) 25 MCG (1000 UNIT) tablet Take 1,000 Units by mouth daily.     Docusate Sodium  100 MG capsule Take 100 mg by mouth daily.     donepezil  (ARICEPT ) 5 MG tablet Take 5 mg by mouth at bedtime.     fluticasone  (FLONASE ) 50 MCG/ACT nasal spray Place 1 spray into both nostrils daily as needed for allergies.     HYDROcodone -acetaminophen  (NORCO/VICODIN) 5-325 MG tablet Take 1 tablet by mouth every 6 (six) hours as needed for moderate pain (pain score 4-6). 14 tablet 0   Magnesium  Oxide (MAG-OX PO) Take 400 mg by mouth 2 (two) times daily.     meclizine  (ANTIVERT ) 25 MG tablet Take 25 mg by mouth 3 (three) times daily as needed for dizziness.     meloxicam (MOBIC)  15 MG tablet Take 15 mg by mouth daily as needed for pain.     metoprolol  tartrate (LOPRESSOR ) 25 MG tablet Take 25 mg by mouth 2 (two) times daily.     mirabegron  ER (MYRBETRIQ ) 25 MG TB24 tablet Take 25 mg by mouth daily.     Omega-3 Fatty Acids (FISH OIL) 1000 MG CAPS Take 1,000 mg by mouth daily.     ondansetron  (ZOFRAN ) 8 MG tablet Take 1 tablet (8 mg total) by mouth every 8 (eight) hours as needed for nausea or vomiting. 20 tablet 2   ondansetron  (ZOFRAN -ODT) 8 MG disintegrating tablet Take 8 mg by mouth every 8 (eight) hours as needed for nausea.     phentermine  37.5 MG capsule Take 37.5 mg by mouth every morning.     Probiotic Product (PROBIOTIC GUMMIES PO) Take 1 tablet by mouth daily. 500 million     cetirizine (ZYRTEC) 10 MG tablet Take 10 mg by mouth daily as needed for allergies.     oxymetazoline  (AFRIN) 0.05 % nasal spray Place 1 spray into both nostrils daily as needed for congestion.      Physical Exam: Vitals:   03/25/23 0553  BP: 128/81  Pulse: 77  Resp: 18  Temp: 97.6 F (36.4 C)  SpO2: 99%  Body mass index is 24.03 kg/m. General: AAOX3, well developed and well nourished, NAD Ambulation Normal uses no assitive devices Heart: RRR. Lungs: Normal pulmonary effort, no signs of respiratory distress Abdomen: Normal bowel soundsX4, soft, non-tender, no hepatosplenomegaly. Skin: No abnormal lesions, abrasions or contusions. MSK: No obvious bony abnormalities, tenderness to palpation over lumbar spine AROM Lumbar Spine: -Spine: normal ROM, pain elicited with fwd flexion and extension  - Knee: flexion and extension normal and pain free bilaterally.  - Ankle: Dorsiflexion, plantarflexion, inversion, eversion normal and pain free.  Dermatomes:Lower extremity sensation to light touch intact bilaterally with positive dysathesias on the left.  Myotomes:  - L2: Left 5/5, Right 5/5  -L3: Left 5/5, Right 5/5  - L4: Left 5/5, Right 5/5  - L5: Left 5/5, Right 5/5  -  S1: Left 5/5, Right 5/5   Reflexes:  - Patella: Left2+, Right 2+  Achilles: Left2+, Right 2+  - Babinski: Left Negative, Right Negative  - Clonus: Negative  Special Tests:  - Straight Leg Raise: Left Negative, Right Negative - FABER Test:Negative  PV: Extremities warm and well perfused. Posterior and dorsalis pedis pulse 2+ bilaterally, No pitting Edema, discoloration, calf tenderness, or palpable cords. Homan's negative bilaterally.  Xrays of the lumbar spine show a degenerative scoliosis with a grDE 1 bosrderline 2 anterolisthesis of L4-L5.  The Lumbar MRI preformed on 09/07/2022 showed a sacralized L5 vertebrae. Degenerative disease at multiple levels of the lumbar spine. At L4-L5 there is a grade 1 anterolisthesis with central stenosis severe right and mild to moderate left foraminal stenosis.  No change on repeat MRI completed on 03/20/23.    A/P:  Assessment: Kristy [name] is a very pleasant 68 year old female who presents to our office today to discuss surgical intervention. She previously was being seen by Dr. Baird for surgery was ordered referral lumbar decompression. The patient has failed conservative treatment options. She did a trial of physical therapy from April to June 2024; this did not improve her symptoms. She also received an ESI injection by Dr. Bonner on September 22, 2022, she states that this did not help improve her symptoms. Has a past medical history that is significant for sarcoidosis that is managed by her primary care provider Luke Lamer; and a history of a stroke. Her stroke was in early 2023, her neurologist is Dr. Eather Popp at [facility]. On exam she does complain of left-sided radicular leg pain. Fortunately, she is neurologically intact. She reports that she has no lasting neurological deficits from her prior stroke. Based on clinical exam findings and imaging studies we discussed that surgical intervention is warranted at this time due to  her worsening pain and radicular left leg symptoms and failed conservative treatment options. Upon further discussion with the patient, she states that her back pain and radicular left leg pain are affecting her quality of life negatively and equally. Therefore, pending medical clearance we wish to move forward with a TLIF at L4-L5. A TLIF fusion pamphlet was provided to the patient at today's visit. Medical clearance forms are to be completed by the patient's primary care provider Luke Lamer and her neurologist.    Risks and benefits of surgery were discussed with the patient. These include: Infection, bleeding, death, stroke, paralysis, ongoing or worse pain, need for additional surgery, nonunion, leak of spinal fluid, adjacent segment degeneration requiring additional fusion surgery, Injury to abdominal vessels that can require anterior surgery to stop bleeding. Malposition of the cage and/or pedicle screws  that could require additional surgery. Loss of bowel and bladder control. Postoperative hematoma causing neurologic compression that could require urgent or emergent re-operation.    Goals of surgery: Reduced (not eliminated) pain, and improved quality of life.    Impression: Lumbar radiculopathy, Grade 1 spondylolisthesis L4-L5

## 2023-03-25 NOTE — Anesthesia Postprocedure Evaluation (Signed)
 Anesthesia Post Note  Patient: Kristy [name]  Procedure(s) Performed: TRANSFORAMINAL LUMBAR INTERBODY FUSION (TLIF) WITH PEDICLE SCREW FIXATION 1 LEVEL  L4-L5     Patient location during evaluation: PACU Anesthesia Type: General Level of consciousness: awake and alert Pain management: pain level controlled Vital Signs Assessment: post-procedure vital signs reviewed and stable Respiratory status: spontaneous breathing, nonlabored ventilation, respiratory function stable and patient connected to nasal cannula oxygen Cardiovascular status: blood pressure returned to baseline and stable Postop Assessment: no apparent nausea or vomiting Anesthetic complications: no   No notable events documented.  Last Vitals:  Vitals:   03/25/23 1400 03/25/23 1420  BP:  97/63  Pulse: 81 77  Resp:  20  Temp: 36.9 C 36.6 C  SpO2: 97% 97%    Last Pain:  Vitals:   03/25/23 1420  TempSrc: Oral  PainSc:    Pain Goal:                   Garnette DELENA Gab

## 2023-03-25 NOTE — Plan of Care (Signed)

## 2023-03-25 NOTE — Discharge Instructions (Signed)

## 2023-03-25 NOTE — Op Note (Signed)
 OPERATIVE REPORT  DATE OF SURGERY: 03/25/2023  PATIENT NAME:  Kristy ZAVALETA MRN: [id number] DOB: 1955/06/16  PCP: Theo Iha, MD  PRE-OPERATIVE DIAGNOSIS: Degenerative spondylolisthesis L4-5 with radiculopathy.   POST-OPERATIVE DIAGNOSIS: same  PROCEDURE:   TLIF L4-5  SURGEON:  Donaciano Sprang, MD  PHYSICIAN ASSISTANT: Jeoffrey Sages  ANESTHESIA:   General  EBL: 300 ml   Complications: Patient does have transitional anatomy.  Initial MRI was read as grade 2 spondylolisthesis at L4-5.  Subsequent MRI indicated grade 2 spondylolisthesis at L5-S1.  Should be noted that these represent the same levels.  To prevent confusion and maintained the numbering from her original MRI report.  Implants: NuVasive expandable TLIF cage.  7 x 9 x 24.  Expanded to an anterior height of 12 mm and posterior height of 9 mm of the cage with a a 17 degrees lordosis.  5.5 x 40 mm length cortical pedicle screws at L4.  6.5 x 35 m length at L5.  40 mm in length rod.    Graft: DBX, and autograft from decompression.  Neuromonitoring: All 4 pedicle screws were directly stimulated.  Left side: L4 positive activity at 21 mA.  L5: Positive activity 18 mA.  Right L4: Positive activity 27 minutes.  L5: Positive activity 19 mA.  No adverse SSEP or EMG activity noted.  BRIEF HISTORY: Kristy [name] is a 68 y.o. female who has had progressive back buttock and radicular left leg pain.  Attempts at conservative management had failed to alleviate her pain or improve her quality of life.  As result we elected to move forward with surgery.  All appropriate risks, benefits, and alternatives were discussed with the patient and consent was obtained.  PROCEDURE DETAILS: Patient was brought into the operating room and was properly positioned on the operating room table.  After induction with general anesthesia the patient was endotracheally intubated.  A timeout was taken to confirm all important data: including patient, procedure,  and the level. Teds, SCD's were applied.   A Foley was placed by the nurse and the neuromonitoring rep placed all appropriate needles for intraoperative SSEP and free running EMG monitoring.  The patient was then turned prone on the [facility] spine frame.  All bony prominences were well-padded and the back was prepped and draped in a standard fashion.  Using fluoroscopy I identified the appropriate levels and marked out my incision site.  I then infiltrated the incision site with quarter percent Marcaine  with epinephrine .  The midline incision was made and sharp dissection was carried out down to the deep fascia.  I incised the deep fascia and stripped the paraspinal muscles to expose the L4 and L5 spinous process as well as the lamina and facet complex.  There was significant facet arthrosis noted with multiple large cysts at L4-5.  Using a curette I moved all of the degenerative tissue to expose the L3-4 and L4-5 facet complexes bilaterally.  With the posterior exposure complete I placed my bur on the inferior medial portion of the L4 pedicle and confirmed position AP fluoroscopy.  Once this was confirmed I scored through the cortex and then placed the pedicle awl.  Aiming for the superior lateral corner I advanced the probe using AP fluoroscopy.  As I neared the lateral third of the pedicle I confirmed on the AP view that I was just beyond the posterior wall the vertebral body.  Once I confirm satisfactory trajectory advanced into the vertebral body.  I removed all and then  palpated the hole with a ball-tipped feeler.  Once I confirmed that the tunnel was intact I then tapped and repalpated with a ball-tipped feeler.  I then placed the 40 mm length cortical pedicle screw.  It had excellent purchase.  X-rays demonstrated satisfactory positioning in both planes.  Using the same technique I placed the 40 mm length cortical pedicle screw on the contralateral side.  Once both L4 pedicle screws were in place I  repositioned to place my L5.  I again placed my awl at the inferior medial corner of the pedicle and used the same technique I had used at L4 to cannulate the pedicle.  I then palpated the hole to confirm it was intact and then tapped with the 6.5 diameter tap.  After reconfirming that the bony canal was intact I placed the 6.5x35 mm length screws.  I then directly stimulated all 4 screws and there was no adverse free running EMG activity to suggest breach of the pedicle and direct neural irritation.  Final AP and lateral x-rays demonstrated satisfactory positioning of all 4 screws.  A double-action Leksell rongeur was used to remove the bulk of the L4 spinous process.  I then removed the bulk of the L4 lamina bilaterally.  Using Kerrison rongeurs I performed a generous on the right side and a near complete laminectomy on the left.  This was done with a Kerrison rongeur.  I then remove the entire inferior L4 facet on the left side.  I could now visualize the superior L5 facet and the L4 pars.  Using the Kerrison rongeur I resected the pars and then resected the superior portion of the L5 facet.  I could now visualize the superior portion of the L5 pedicle and I could see the L4 nerve root in the foramen.  I then removed the remaining portion of the ligamentum flavum to expose the lateral aspect of the thecal sac as well as the L5 nerve root.  A neuro patty was placed inferiorly to gently retract and protect the L5 nerve root.  A superior patty was placed to protect the L4 nerve root.  Hand-held retractor was placed immediately to gently retract the thecal sac.  I could now visualize the posterior aspect of the annulus.  An annulotomy was then performed with a 15 blade scalpel and I began my discectomy.  Using sidecutting curettes, and pituitary rongeurs I remove the bulk of the disc material.  I confirmed on x-ray that my curettes were past the midline.  I also confirmed after completing an extensive discectomy  that I had parallel endplate distraction using a lamina spreader.  I could now pass a [facility] freely down into the right posterior lateral corner.  Even though she was not having significant neuropathic right leg pain I did do a decompression into the right lateral recess.  I resected the medial portion of the facet complex to decompress the L5 nerve root completely in the lateral recess.  Medial foraminotomy was also performed on this right side.  I can now palpate and visualize the superior and medial aspect of the right L5 pedicle confirming an adequate posterior lateral decompression of this side.  With the neural decompression proving satisfactory I proceeded with placing the intervertebral cage.  Using fluoroscopy I advanced the cage towards the midline.  I confirmed that I was just at the midline as seen on the AP view using this intact remaining spinous process as my guide.  I then expanded the cage  anteriorly and posteriorly until it became a snug fit in the disc space.  The cage was quite secure and was countersunk.  I removed the inserter and confirmed satisfactory positioning in both planes.  I then placed bone graft that had harvested from the decompression lateral to the cage in the intervertebral space to aid in the overall fusion.  The cage itself was backfilled with DBX.  I now was able to freely palpate on the left side along the L5 nerve root and into the foramen.  The nerve root itself was freely mobile and no longer under compression.  The L4 nerve root was also directly visualized and I was able to easily pass my nerve hook superiorly along the medial aspect of the L4 pedicle confirming satisfactory decompression.  I then used a high-speed bur to decorticate the remaining portion of the right L4-5 facet complex as well as the L4 nerves on the right side and the transverse processes of L4 and L5.  I then placed the autograft bone in the posterior lateral gutter and in the L4-5 facet  to aid in the fusion.  With the posterior lateral arthrodesis and the intervertebral cage properly positioned and secured I applied the polyaxial heads to each of the screws.  I then obtained the rods and locked them in place with the caps.  All 4 caps were tightened according manufacture standards.  Floseal was then placed to aid in hemostasis and I irrigated the wound copiously with normal saline.  I did 1 final check to ensure that the decompression remained satisfactory and no bone material had displaced onto the nerve.  I then placed a thrombin -soaked Gelfoam patty over the exposed thecal sac from the laminotomy and decompression site.  Retractors were removed and I closed the deep fascia with a running #1 strata fix suture.  I then closed superficial with interrupted 2-0 Vicryl sutures and a 3-0 Monocryl for the skin.  Steri-Strips and dry dressings were applied and the patient was extubated and transferred the PACU without incident.  The end of the case all needle and sponge counts were correct.  There were no adverse intraoperative events.  Donaciano Sprang, MD 03/25/2023 12:16 PM

## 2023-03-25 NOTE — Transfer of Care (Signed)
 Immediate Anesthesia Transfer of Care Note  Patient: Kristy [name]  Procedure(s) Performed: TRANSFORAMINAL LUMBAR INTERBODY FUSION (TLIF) WITH PEDICLE SCREW FIXATION 1 LEVEL  L4-L5  Patient Location: PACU  Anesthesia Type:General  Level of Consciousness: awake and alert   Airway & Oxygen Therapy: Patient Spontanous Breathing and Patient connected to face mask oxygen  Post-op Assessment: Report given to RN and Post -op Vital signs reviewed and stable  Post vital signs: Reviewed and stable  Last Vitals:  Vitals Value Taken Time  BP 123/62 03/25/23 1225  Temp    Pulse 95 03/25/23 1229  Resp 14 03/25/23 1229  SpO2 99 % 03/25/23 1229  Vitals shown include unfiled device data.  Last Pain:  Vitals:   03/25/23 0607  TempSrc:   PainSc: 5          Complications: No notable events documented.

## 2023-03-25 NOTE — Anesthesia Procedure Notes (Signed)
 Procedure Name: Intubation Date/Time: 03/25/2023 7:46 AM  Performed by: Vinita Geanie LABOR, CRNAPre-anesthesia Checklist: Patient identified, Emergency Drugs available, Suction available and Patient being monitored Patient Re-evaluated:Patient Re-evaluated prior to induction Oxygen Delivery Method: Circle system utilized Preoxygenation: Pre-oxygenation with 100% oxygen Induction Type: IV induction Ventilation: Mask ventilation without difficulty Laryngoscope Size: Glidescope and 4 Grade View: Grade I Tube type: Oral Tube size: 7.0 mm Number of attempts: 1 Airway Equipment and Method: Stylet and Oral airway Placement Confirmation: ETT inserted through vocal cords under direct vision, positive ETCO2 and breath sounds checked- equal and bilateral Secured at: 21 cm Tube secured with: Tape Dental Injury: Teeth and Oropharynx as per pre-operative assessment

## 2023-03-25 NOTE — Brief Op Note (Signed)
 03/25/2023  12:33 PM  PATIENT:  Kristy [name]  68 y.o. female  PRE-OPERATIVE DIAGNOSIS:  Grade 1 Spondylolisthesis with Radculopathy L4-L5  POST-OPERATIVE DIAGNOSIS:  Grade 1 Spondylolisthesis with Radculopathy L4-L5  PROCEDURE:  Procedure(s) with comments: TRANSFORAMINAL LUMBAR INTERBODY FUSION (TLIF) WITH PEDICLE SCREW FIXATION 1 LEVEL  L4-L5 (N/A) - 240  SURGEON:  Surgeons and Role:    DEWAINE Burnetta Aures, MD - Primary  PHYSICIAN ASSISTANT:   ASSISTANTS: Jeoffrey Sages   ANESTHESIA:   general  EBL:  300 mL   BLOOD ADMINISTERED:none  DRAINS: none   LOCAL MEDICATIONS USED:  MARCAINE      SPECIMEN:  No Specimen  DISPOSITION OF SPECIMEN:  N/A  COUNTS:  YES  TOURNIQUET:  * No tourniquets in log *  DICTATION: .Dragon Dictation  PLAN OF CARE: Admit to inpatient   PATIENT DISPOSITION:  PACU - hemodynamically stable.

## 2023-03-26 ENCOUNTER — Encounter (HOSPITAL_COMMUNITY): Payer: Self-pay | Admitting: Orthopedic Surgery

## 2023-03-26 MED ORDER — HYDROCODONE-ACETAMINOPHEN 5-325 MG PO TABS: 1.0000 | ORAL_TABLET | Freq: Four times a day (QID) | ORAL | 0 refills | Status: AC | PRN

## 2023-03-26 MED ORDER — SORBITOL 70 % SOLN
30.0000 mL | Freq: Once | Status: AC
  Administered 2023-03-26: 30 mL via ORAL
  Filled 2023-03-26: qty 30

## 2023-03-26 MED FILL — Thrombin For Soln Kit 20000 Unit: CUTANEOUS | Qty: 1 | Status: AC

## 2023-03-26 NOTE — Plan of Care (Signed)

## 2023-03-26 NOTE — Evaluation (Signed)
 Occupational Therapy Evaluation Patient Details Name: Kristy [name] MRN: [id number] DOB: [birth year] Today's Date: 03/26/2023   History of Present Illness Kristy [name] is a 68 yo female presenting with back and left leg pain. Workup revealed spondylolisthesis with radiculopathy at L4-5. TLIF L4-5 performed on 03/25/2023. PMHx includes arthritis, hyperlipidemia, sacoidosis, stroke, SVT, vertigo   Clinical Impression   Pt evaluated s/p the admission list above. At baseline, pt lives alone and completes all ADLs/IADLs and functional mobility independently. Upon evaluation, pt was limited by knowledge of back precautions, compensatory strategies, activity tolerance, pain, and reports of dizziness. Pt with great carryover of back precautions and compensatory strategies and was able to complete upper and lower body dressing tasks with up to set up assistance using compensatory strategies and all functional mobility tasks with up to supervision using DME due to reports of dizziness. Pt reported having chronic dizziness and is able to manage symptoms at home. Recommend pt discharge home with family support without follow-up OT services.       If plan is discharge home, recommend the following: A little help with walking and/or transfers;A little help with bathing/dressing/bathroom;Assistance with cooking/housework;Assist for transportation;Help with stairs or ramp for entrance    Functional Status Assessment  Patient has had a recent decline in their functional status and demonstrates the ability to make significant improvements in function in a reasonable and predictable amount of time.  Equipment Recommendations  None recommended by OT    Recommendations for Other Services       Precautions / Restrictions Precautions Precautions: Back;Fall Precaution Booklet Issued: Yes (comment) Precaution Comments: Reviewed back precautions. Pt verbalized understanding and able to recall Required Braces or  Orthoses: Spinal Brace Spinal Brace: Lumbar corset Restrictions Weight Bearing Restrictions Per Provider Order: No      Mobility Bed Mobility Overal bed mobility: Needs Assistance Bed Mobility: Supine to Sit, Sit to Supine     Supine to sit: Supervision Sit to supine: Supervision   General bed mobility comments: HOB elevated without use of bed rails.    Transfers Overall transfer level: Needs assistance Equipment used: Rolling walker (2 wheels) Transfers: Sit to/from Stand Sit to Stand: Supervision           General transfer comment: Pt educated on B hand placement. Provided with supervision due to reports of dizziness.      Balance Overall balance assessment: Needs assistance Sitting-balance support: No upper extremity supported, Feet supported Sitting balance-Leahy Scale: Good Sitting balance - Comments: Engaged in lower body dressing tasks while seated EOB   Standing balance support: No upper extremity supported, During functional activity Standing balance-Leahy Scale: Good Standing balance comment: Performed clothing management tasks while standing unsupported.                           ADL either performed or assessed with clinical judgement   ADL Overall ADL's : Needs assistance/impaired Eating/Feeding: Independent   Grooming: Wash/dry hands;Wash/dry face;Oral care;Applying deodorant;Brushing hair;Supervision/safety;Standing   Upper Body Bathing: Supervision/ safety;Sitting   Lower Body Bathing: Supervison/ safety;Sit to/from stand   Upper Body Dressing : Set up;Sitting   Lower Body Dressing: Set up;Sit to/from stand   Toilet Transfer: Supervision/safety;Ambulation;Rolling walker (2 wheels)   Toileting- Clothing Manipulation and Hygiene: Supervision/safety;Sit to/from stand       Functional mobility during ADLs: Supervision/safety;Rolling walker (2 wheels) General ADL Comments: Pt with reports of dizziness. Pt provided with supervision  for safety and educated on compensatory  strategies and back precautions. Pt with great carryover.     Vision Ability to See in Adequate Light: 0 Adequate Patient Visual Report: No change from baseline Vision Assessment?: No apparent visual deficits     Perception Perception: Within Functional Limits       Praxis Praxis: Not tested       Pertinent Vitals/Pain Pain Assessment Pain Assessment: Faces Faces Pain Scale: Hurts a little bit Pain Location: back Pain Descriptors / Indicators: Guarding, Discomfort Pain Intervention(s): Limited activity within patient's tolerance, Monitored during session     Extremity/Trunk Assessment Upper Extremity Assessment Upper Extremity Assessment: Overall WFL for tasks assessed   Lower Extremity Assessment Lower Extremity Assessment: Defer to PT evaluation   Cervical / Trunk Assessment Cervical / Trunk Assessment: Back Surgery   Communication Communication Communication: No apparent difficulties   Cognition Arousal: Alert Behavior During Therapy: WFL for tasks assessed/performed Overall Cognitive Status: Within Functional Limits for tasks assessed                                 General Comments: Answered all questions appropriately. Able to recall back precautions.     General Comments  VSS on room air    Exercises     Shoulder Instructions      Home Living Family/patient expects to be discharged to:: Private residence Living Arrangements: Alone Available Help at Discharge: Available PRN/intermittently (sister and niece) Type of Home: House Home Access: Stairs to enter;Ramped entrance Entrance Stairs-Number of Steps: 4 (to enter through back door; ramp enterance in front) Entrance Stairs-Rails: Right Home Layout: One level     Bathroom Shower/Tub: Producer, Television/film/video: Standard Bathroom Accessibility: Yes   Home Equipment: Grab bars - tub/shower;Shower seat - built in   Additional Comments:  Sink close by shower to assist with transferring into shower. Has a reacher at home      Prior Functioning/Environment Prior Level of Function : Independent/Modified Independent             Mobility Comments: Completed functional mobility without use of DME ADLs Comments: Completed ADLs        OT Problem List: Decreased strength;Decreased activity tolerance;Impaired balance (sitting and/or standing);Decreased knowledge of use of DME or AE;Decreased knowledge of precautions;Pain      OT Treatment/Interventions:      OT Goals(Current goals can be found in the care plan section) Acute Rehab OT Goals Patient Stated Goal: go home OT Goal Formulation: With patient Time For Goal Achievement: 03/26/23 Potential to Achieve Goals: Good  OT Frequency:      Co-evaluation              AM-PAC OT 6 Clicks Daily Activity     Outcome Measure Help from another person eating meals?: None Help from another person taking care of personal grooming?: A Little Help from another person toileting, which includes using toliet, bedpan, or urinal?: A Little Help from another person bathing (including washing, rinsing, drying)?: A Little Help from another person to put on and taking off regular upper body clothing?: A Little Help from another person to put on and taking off regular lower body clothing?: A Little 6 Click Score: 19   End of Session Equipment Utilized During Treatment: Gait belt;Rolling walker (2 wheels) Nurse Communication: Mobility status  Activity Tolerance: Patient tolerated treatment well Patient left: in bed;with call bell/phone within reach  OT Visit Diagnosis: Unsteadiness on feet (R26.81);Other  abnormalities of gait and mobility (R26.89);Muscle weakness (generalized) (M62.81);Pain Pain - part of body:  (back)                Time: 9068-9048 OT Time Calculation (min): 20 min Charges:  OT General Charges $OT Visit: 1 Visit OT Evaluation $OT Eval Low Complexity: 1  Low  [address] MOTS Analissa Bayless 03/26/2023, 10:25 AM

## 2023-03-26 NOTE — Progress Notes (Signed)
    Subjective: Procedure(s) (LRB): TRANSFORAMINAL LUMBAR INTERBODY FUSION (TLIF) WITH PEDICLE SCREW FIXATION 1 LEVEL  L4-L5 (N/A) 1 Day Post-Op  Patient reports pain as 3 on 0-10 scale.  Reports decreased leg pain reports incisional back pain   Positive void Negative bowel movement Negative flatus Negative chest pain or shortness of breath  Objective: Vital signs in last 24 hours: Temp:  [97.9 F (36.6 C)-98.5 F (36.9 C)] 98.5 F (36.9 C) (02/07 1133) Pulse Rate:  [66-87] 87 (02/07 1133) Resp:  [18-20] 20 (02/07 1133) BP: (97-123)/(50-63) 123/56 (02/07 1133) SpO2:  [95 %-100 %] 100 % (02/07 1133)  Intake/Output from previous day: 02/06 0701 - 02/07 0700 In: 2740 [P.O.:240; I.V.:2000; IV Piggyback:500] Out: 1050 [Urine:750; Blood:300]  Labs: No results for input(s): WBC, RBC, HCT, PLT in the last 72 hours. No results for input(s): NA, K, CL, CO2, BUN, CREATININE, GLUCOSE, CALCIUM  in the last 72 hours. No results for input(s): LABPT, INR in the last 72 hours.  Physical Exam: Neurologically intact ABD soft Intact pulses distally Incision: dressing C/D/I and no drainage Compartment soft Body mass index is 24.03 kg/m.   Assessment/Plan: Patient stable  Continue mobilization with physical therapy Continue care  Overall patient is doing quite well status post TLIF.  The preoperative neuropathic leg pain is significantly improved. Patient is ambulating, tolerating regular diet, and her pain is well-controlled with oral medications.  Will plan on discharge to home if she has positive flatus or bowel movement.  If this does not occur she will stay the night and we will plan on discharge tomorrow.  Donaciano Sprang, MD [facility] [phone]

## 2023-03-26 NOTE — Discharge Summary (Signed)
 Patient ID: LARAY RIVKIN MRN: [id number] DOB/AGE: [birth year] 68 y.o.  Admit date: 03/25/2023 Discharge date: 03/26/2023  Admission Diagnoses:  Principal Problem:   S/P lumbar fusion   Discharge Diagnoses:  Principal Problem:   S/P lumbar fusion  status post Procedure(s): TRANSFORAMINAL LUMBAR INTERBODY FUSION (TLIF) WITH PEDICLE SCREW FIXATION 1 LEVEL  L4-L5  Past Medical History:  Diagnosis Date   Arthritis    Dizziness    Dizziness and giddiness    Hyperlipidemia    Migraine    Sarcoidosis    pt states she is unable to lay flat   Stress incontinence    Stroke (HCC)    SVT (supraventricular tachycardia) (HCC)    Vertigo     Surgeries: Procedure(s): TRANSFORAMINAL LUMBAR INTERBODY FUSION (TLIF) WITH PEDICLE SCREW FIXATION 1 LEVEL  L4-L5 on 03/25/2023   Consultants:   Discharged Condition: Improved  Hospital Course: ALAINA DONATI is an 68 y.o. female who was admitted 03/25/2023 for operative treatment of S/P lumbar fusion. Patient failed conservative treatments (please see the history and physical for the specifics) and had severe unremitting pain that affects sleep, daily activities and work/hobbies. After pre-op clearance, the patient was taken to the operating room on 03/25/2023 and underwent  Procedure(s): TRANSFORAMINAL LUMBAR INTERBODY FUSION (TLIF) WITH PEDICLE SCREW FIXATION 1 LEVEL  L4-L5.    Patient was given perioperative antibiotics:  Anti-infectives (From admission, onward)    Start     Dose/Rate Route Frequency Ordered Stop   03/25/23 1600  ceFAZolin  (ANCEF ) IVPB 1 g/50 mL premix        1 g 100 mL/hr over 30 Minutes Intravenous Every 8 hours 03/25/23 1334 03/26/23 0922   03/25/23 0546  ceFAZolin  (ANCEF ) IVPB 2g/100 mL premix        2 g 200 mL/hr over 30 Minutes Intravenous 30 min pre-op 03/25/23 0546 03/25/23 0748        Patient was given sequential compression devices and early ambulation to prevent DVT.   Patient benefited maximally from hospital  stay and there were no complications. At the time of discharge, the patient was urinating/moving their bowels without difficulty, tolerating a regular diet, pain is controlled with oral pain medications and they have been cleared by PT/OT.   Recent vital signs: Patient Vitals for the past 24 hrs:  BP Temp Temp src Pulse Resp SpO2  03/26/23 1657 (!) 116/51 98.8 F (37.1 C) Oral 99 20 98 %  03/26/23 1133 (!) 123/56 98.5 F (36.9 C) Oral 87 20 100 %  03/26/23 0740 (!) 113/56 98.4 F (36.9 C) Oral 74 19 100 %  03/26/23 0258 (!) 109/52 98.1 F (36.7 C) Oral 69 18 95 %  03/25/23 2243 (!) 100/50 98.1 F (36.7 C) Oral 66 18 95 %  03/25/23 1910 (!) 118/59 98.2 F (36.8 C) Oral 80 18 98 %     Recent laboratory studies: No results for input(s): WBC, HGB, HCT, PLT, NA, K, CL, CO2, BUN, CREATININE, GLUCOSE, INR, CALCIUM  in the last 72 hours.  Invalid input(s): PT, 2   Discharge Medications:   Allergies as of 03/26/2023   No Known Allergies      Medication List     STOP taking these medications    aspirin  EC 81 MG tablet   cholecalciferol 25 MCG (1000 UNIT) tablet Commonly known as: VITAMIN D3   Fish Oil 1000 MG Caps   meloxicam 15 MG tablet Commonly known as: MOBIC   ondansetron  8 MG disintegrating tablet Commonly known  as: ZOFRAN -ODT   SUPER B COMPLEX PO   VITA-C PO       TAKE these medications    cetirizine 10 MG tablet Commonly known as: ZYRTEC Take 10 mg by mouth daily as needed for allergies.   Docusate Sodium  100 MG capsule Take 100 mg by mouth daily.   donepezil  5 MG tablet Commonly known as: ARICEPT  Take 5 mg by mouth at bedtime.   fluticasone  50 MCG/ACT nasal spray Commonly known as: FLONASE  Place 1 spray into both nostrils daily as needed for allergies.   HYDROcodone -acetaminophen  5-325 MG tablet Commonly known as: NORCO/VICODIN Take 1-2 tablets by mouth every 6 (six) hours as needed for up to 5 days for moderate pain  (pain score 4-6). What changed: how much to take   MAG-OX PO Take 400 mg by mouth 2 (two) times daily.   meclizine  25 MG tablet Commonly known as: ANTIVERT  Take 25 mg by mouth 3 (three) times daily as needed for dizziness.   methocarbamol  500 MG tablet Commonly known as: ROBAXIN  Take 1 tablet (500 mg total) by mouth every 8 (eight) hours as needed for up to 5 days for muscle spasms.   metoprolol  tartrate 25 MG tablet Commonly known as: LOPRESSOR  Take 25 mg by mouth 2 (two) times daily.   mirabegron  ER 25 MG Tb24 tablet Commonly known as: MYRBETRIQ  Take 25 mg by mouth daily.   ondansetron  4 MG tablet Commonly known as: Zofran  Take 1 tablet (4 mg total) by mouth every 8 (eight) hours as needed for nausea or vomiting. What changed:  medication strength how much to take   oxymetazoline  0.05 % nasal spray Commonly known as: AFRIN Place 1 spray into both nostrils daily as needed for congestion.   phentermine  37.5 MG capsule Take 37.5 mg by mouth every morning.   PROBIOTIC GUMMIES PO Take 1 tablet by mouth daily. 500 million               Durable Medical Equipment  (From admission, onward)           Start     Ordered   03/26/23 1000  For home use only DME Walker rolling  Once       Question Answer Comment  Walker: With 5 Inch Wheels   Patient needs a walker to treat with the following condition S/P lumbar spinal fusion      03/26/23 1000            Diagnostic Studies: DG Lumbar Spine 2-3 Views Result Date: 03/25/2023 CLINICAL DATA:  Elective surgery. EXAM: LUMBAR SPINE - 2-3 VIEW COMPARISON:  Radiograph 04/15/2022 FINDINGS: Three fluoroscopic spot views of the lumbar spine in the operating room submitted. Posterior rod with intrapedicular screw fusion and interbody spacer in the lower lumbar spine. Fluoroscopy time 3 minutes 41 seconds. Dose 134.56 mGy. IMPRESSION: Intraoperative fluoroscopy during lumbar spine fusion. Electronically Signed   By: Andrea Gasman M.D.   On: 03/25/2023 15:47   DG C-Arm 1-60 Min-No Report Result Date: 03/25/2023 Fluoroscopy was utilized by the requesting physician.  No radiographic interpretation.   DG C-Arm 1-60 Min-No Report Result Date: 03/25/2023 Fluoroscopy was utilized by the requesting physician.  No radiographic interpretation.   DG C-Arm 1-60 Min-No Report Result Date: 03/25/2023 Fluoroscopy was utilized by the requesting physician.  No radiographic interpretation.   DG C-Arm 1-60 Min-No Report Result Date: 03/25/2023 Fluoroscopy was utilized by the requesting physician.  No radiographic interpretation.    Discharge Instructions  Incentive spirometry RT   Complete by: As directed         Follow-up Information     Burnetta Aures, MD. Schedule an appointment as soon as possible for a visit in 2 week(s).   Specialty: Orthopedic Surgery Why: If symptoms worsen, For suture removal, For wound re-check Contact information: [address] [place] [address] [phone]                 Discharge Plan:  discharge to home  Disposition: Chrishonda  is a very pleasant 68 year old female who is POD 1 from a TLIF of L4-L5. Prior to surgery she complained of left leg pain and dysesthesias. At this time, she states that her left radicular leg pain is significantly improved and she states that she only has mild incisional pain. Overall, she is doing very well post-op. She is ambulating on her own and states that her pain is well controlled with the pain medications provided.  Patient is also had positive flatus. Patient will have a 2 week post-op appointment with us  in the office in 2 weeks.       Signed: Aures JONETTA Burnetta for Dr. Aures Burnetta [facility] [phone] 03/26/2023, 5:05 PM

## 2023-03-26 NOTE — Evaluation (Signed)
 Physical Therapy Evaluation  Patient Details Name: Kristy [name] MRN: [id number] DOB: February 14, 1956 Today's Date: 03/26/2023  History of Present Illness  Earlene L. Orsak is a 68 yo female presenting with back and left leg pain. Workup revealed spondylolisthesis with radiculopathy at L4-5. TLIF L4-5 performed on 03/25/2023. PMHx includes arthritis, hyperlipidemia, sarcoidosis, stroke, SVT, vertigo  Clinical Impression  Pt admitted with above diagnosis. At the time of PT eval, pt was able to demonstrate transfers and ambulation with gross CGA to supervision for safety and no AD. Pt prefers to have the walker present and close if needed but was able to ambulate without it in hall. Pt asking for RW for home just in case she needs it which is reasonable. Pt was educated on precautions, brace application/wearing schedule, appropriate activity progression, and car transfer. Pt currently with functional limitations due to the deficits listed below (see PT Problem List). Pt will benefit from skilled PT to increase their independence and safety with mobility to allow discharge to the venue listed below.          If plan is discharge home, recommend the following: A little help with walking and/or transfers;A little help with bathing/dressing/bathroom;Assistance with cooking/housework;Assist for transportation;Help with stairs or ramp for entrance   Can travel by private vehicle        Equipment Recommendations Rolling walker (2 wheels)  Recommendations for Other Services       Functional Status Assessment Patient has had a recent decline in their functional status and demonstrates the ability to make significant improvements in function in a reasonable and predictable amount of time.     Precautions / Restrictions Precautions Precautions: Back;Fall Precaution Booklet Issued: Yes (comment) Precaution Comments: Reviewed handout and pt was cued for precautions during functional mobility. Required Braces or  Orthoses: Spinal Brace Spinal Brace: Lumbar corset;Applied in sitting position Restrictions Weight Bearing Restrictions Per Provider Order: No      Mobility  Bed Mobility Overal bed mobility: Needs Assistance Bed Mobility: Rolling, Sidelying to Sit Rolling: Supervision Sidelying to sit: Supervision       General bed mobility comments: HOB elevated without use of bed rails to simulate home environment.    Transfers Overall transfer level: Needs assistance Equipment used: Rolling walker (2 wheels) Transfers: Sit to/from Stand Sit to Stand: Supervision           General transfer comment: Several sit<>stands throughout session to practice. VC's for hand placement on seated surface for safety.    Ambulation/Gait Ambulation/Gait assistance: Contact guard assist Gait Distance (Feet): 300 Feet Assistive device: Rolling walker (2 wheels) Gait Pattern/deviations: Step-through pattern, Decreased stride length, Trunk flexed Gait velocity: Decreased Gait velocity interpretation: <1.31 ft/sec, indicative of household ambulator   General Gait Details: VC's for improved posture. Pt appears mildly unsteady and guarded but without overt LOB.  Stairs Stairs: Yes Stairs assistance: Contact guard assist Stair Management: One rail Left, Step to pattern, Forwards Number of Stairs: 5 General stair comments: VC's for sequencing and general safety.  Wheelchair Mobility     Tilt Bed    Modified Rankin (Stroke Patients Only)       Balance Overall balance assessment: Needs assistance Sitting-balance support: No upper extremity supported, Feet supported Sitting balance-Leahy Scale: Good     Standing balance support: No upper extremity supported, During functional activity Standing balance-Leahy Scale: Fair  Pertinent Vitals/Pain Pain Assessment Pain Assessment: Faces Faces Pain Scale: Hurts a little bit Pain Location: back Pain  Descriptors / Indicators: Guarding, Discomfort Pain Intervention(s): Monitored during session, Limited activity within patient's tolerance, Repositioned    Home Living Family/patient expects to be discharged to:: Private residence Living Arrangements: Alone Available Help at Discharge: Available PRN/intermittently (sister and niece) Type of Home: House Home Access: Stairs to enter;Ramped entrance Entrance Stairs-Rails: Right Entrance Stairs-Number of Steps: 4 (to enter through back door; ramp enterance in front)   Home Layout: One level Home Equipment: Grab bars - tub/shower;Shower seat - built in Additional Comments: Sink close by shower to assist with transferring into shower. Has a reacher at home    Prior Function Prior Level of Function : Independent/Modified Independent             Mobility Comments: Completed functional mobility without use of DME ADLs Comments: Completed ADLs     Extremity/Trunk Assessment   Upper Extremity Assessment Upper Extremity Assessment: Overall WFL for tasks assessed    Lower Extremity Assessment Lower Extremity Assessment: Generalized weakness;LLE deficits/detail LLE Deficits / Details: Pt reports N/T in the L LE since surgery but reports this was the side that was bothering her prior to surgery.    Cervical / Trunk Assessment Cervical / Trunk Assessment: Back Surgery  Communication   Communication Communication: No apparent difficulties  Cognition Arousal: Alert Behavior During Therapy: WFL for tasks assessed/performed Overall Cognitive Status: Within Functional Limits for tasks assessed                                          General Comments General comments (skin integrity, edema, etc.): VSS on room air    Exercises     Assessment/Plan    PT Assessment Patient needs continued PT services  PT Problem List Decreased strength;Decreased activity tolerance;Decreased balance;Decreased mobility;Decreased  knowledge of use of DME;Decreased safety awareness;Decreased knowledge of precautions;Pain       PT Treatment Interventions Gait training;DME instruction;Stair training;Therapeutic activities;Functional mobility training;Balance training;Patient/family education    PT Goals (Current goals can be found in the Care Plan section)  Acute Rehab PT Goals Patient Stated Goal: Home today PT Goal Formulation: With patient Time For Goal Achievement: 04/02/23 Potential to Achieve Goals: Good    Frequency Min 1X/week     Co-evaluation               AM-PAC PT 6 Clicks Mobility  Outcome Measure Help needed turning from your back to your side while in a flat bed without using bedrails?: A Little Help needed moving from lying on your back to sitting on the side of a flat bed without using bedrails?: A Little Help needed moving to and from a bed to a chair (including a wheelchair)?: A Little Help needed standing up from a chair using your arms (e.g., wheelchair or bedside chair)?: A Little Help needed to walk in hospital room?: A Little Help needed climbing 3-5 steps with a railing? : A Little 6 Click Score: 18    End of Session Equipment Utilized During Treatment: Gait belt;Back brace Activity Tolerance: Patient tolerated treatment well Patient left: in bed;with call bell/phone within reach;with family/visitor present Nurse Communication: Mobility status PT Visit Diagnosis: Unsteadiness on feet (R26.81);Pain Pain - part of body:  (back)    Time: 9146-9080 PT Time Calculation (min) (ACUTE ONLY): 26 min  Charges:   PT Evaluation $PT Eval Low Complexity: 1 Low PT Treatments $Gait Training: 8-22 mins PT General Charges $$ ACUTE PT VISIT: 1 Visit         Leita Sable, PT, DPT Acute Rehabilitation Services Secure Chat Preferred Office: [phone]   Leita JONETTA Sable 03/26/2023, 10:47 AM

## 2023-03-27 NOTE — Care Management (Signed)
 Patient with order to DC to home today. Unit staff to provide DME needed for home.   No HH needs identified Patient will have family/ friends provide transportation home. No other TOC needs identified for DC

## 2023-03-27 NOTE — Progress Notes (Signed)
Patient is discharged from room 3C07 at this time. Alert and in stable condition. IV site d/c'd and instructions read to patient with understanding verbalized and all questions answered. Left unit via wheelchair with all belongings at side. 

## 2023-04-07 ENCOUNTER — Encounter: Payer: Self-pay | Admitting: Cardiology

## 2023-04-07 LAB — CUP PACEART REMOTE DEVICE CHECK
Date Time Interrogation Session: 20250208230418
Implantable Pulse Generator Implant Date: 20230120

## 2023-04-29 NOTE — Progress Notes (Signed)
 Carelink Summary Report / Loop Recorder

## 2023-04-29 NOTE — Addendum Note (Signed)
 Addended by: Elease Etienne A on: 04/29/2023 10:51 AM   Modules accepted: Orders

## 2023-05-03 ENCOUNTER — Ambulatory Visit (INDEPENDENT_AMBULATORY_CARE_PROVIDER_SITE_OTHER)

## 2023-05-03 DIAGNOSIS — I639 Cerebral infarction, unspecified: Secondary | ICD-10-CM

## 2023-05-04 LAB — CUP PACEART REMOTE DEVICE CHECK
Date Time Interrogation Session: 20250316054136
Implantable Pulse Generator Implant Date: 20230120

## 2023-05-09 ENCOUNTER — Encounter: Payer: Self-pay | Admitting: Cardiology

## 2023-05-31 ENCOUNTER — Ambulatory Visit
Admission: RE | Admit: 2023-05-31 | Discharge: 2023-05-31 | Disposition: A | Discharge status: Home / Self Care | Source: Ambulatory Visit | Attending: Internal Medicine | Admitting: Internal Medicine

## 2023-05-31 DIAGNOSIS — E2839 Other primary ovarian failure: Secondary | ICD-10-CM

## 2023-06-07 ENCOUNTER — Ambulatory Visit (INDEPENDENT_AMBULATORY_CARE_PROVIDER_SITE_OTHER)

## 2023-06-07 DIAGNOSIS — I639 Cerebral infarction, unspecified: Secondary | ICD-10-CM

## 2023-06-10 LAB — CUP PACEART REMOTE DEVICE CHECK
Date Time Interrogation Session: 20250420231049
Implantable Pulse Generator Implant Date: 20230120

## 2023-06-13 ENCOUNTER — Encounter: Payer: Self-pay | Admitting: Cardiology

## 2023-06-18 NOTE — Progress Notes (Signed)
 Carelink Summary Report / Loop Recorder

## 2023-06-18 NOTE — Addendum Note (Signed)
 Addended by: Edra Govern D on: 06/18/2023 02:39 PM   Modules accepted: Orders

## 2023-07-08 ENCOUNTER — Encounter

## 2023-07-12 ENCOUNTER — Encounter

## 2023-07-16 ENCOUNTER — Telehealth: Payer: Self-pay | Admitting: Cardiology

## 2023-07-16 NOTE — Telephone Encounter (Signed)
°  1. Has your device fired? no ° °2. Is you device beeping? no ° °3. Are you experiencing draining or swelling at device site?no ° °4. Are you calling to see if we received your device transmission?yes ° °5. Have you passed out? no ° ° ° °Please route to Device Clinic Pool °

## 2023-07-16 NOTE — Telephone Encounter (Signed)
 Patient called advised a remote transmission was received. Patient was appreciative of call back.

## 2023-07-26 NOTE — Progress Notes (Signed)
 Carelink Summary Report / Loop Recorder

## 2023-07-26 NOTE — Addendum Note (Signed)
 Addended by: Edra Govern D on: 07/26/2023 12:18 PM   Modules accepted: Orders

## 2023-08-09 ENCOUNTER — Ambulatory Visit: Payer: Self-pay | Admitting: Cardiology

## 2023-08-09 ENCOUNTER — Ambulatory Visit (INDEPENDENT_AMBULATORY_CARE_PROVIDER_SITE_OTHER)

## 2023-08-09 DIAGNOSIS — I639 Cerebral infarction, unspecified: Secondary | ICD-10-CM | POA: Diagnosis not present

## 2023-08-09 LAB — CUP PACEART REMOTE DEVICE CHECK
Date Time Interrogation Session: 20250622232610
Implantable Pulse Generator Implant Date: 20230120

## 2023-08-16 ENCOUNTER — Encounter

## 2023-09-09 ENCOUNTER — Ambulatory Visit

## 2023-09-09 ENCOUNTER — Ambulatory Visit: Payer: Self-pay | Admitting: Cardiology

## 2023-09-09 DIAGNOSIS — I639 Cerebral infarction, unspecified: Secondary | ICD-10-CM | POA: Diagnosis not present

## 2023-09-09 LAB — CUP PACEART REMOTE DEVICE CHECK
Date Time Interrogation Session: 20250723232528
Implantable Pulse Generator Implant Date: 20230120

## 2023-09-13 NOTE — Progress Notes (Signed)
 Carelink Summary Report / Loop Recorder

## 2023-09-20 ENCOUNTER — Encounter

## 2023-10-11 ENCOUNTER — Ambulatory Visit

## 2023-10-11 DIAGNOSIS — I639 Cerebral infarction, unspecified: Secondary | ICD-10-CM | POA: Diagnosis not present

## 2023-10-12 LAB — CUP PACEART REMOTE DEVICE CHECK
Date Time Interrogation Session: 20250823232057
Implantable Pulse Generator Implant Date: 20230120

## 2023-10-13 ENCOUNTER — Ambulatory Visit: Payer: Self-pay | Admitting: Cardiology

## 2023-10-25 ENCOUNTER — Encounter

## 2023-11-03 NOTE — Progress Notes (Signed)
 Remote Loop Recorder Transmission

## 2023-11-11 ENCOUNTER — Ambulatory Visit (INDEPENDENT_AMBULATORY_CARE_PROVIDER_SITE_OTHER)

## 2023-11-11 DIAGNOSIS — I639 Cerebral infarction, unspecified: Secondary | ICD-10-CM | POA: Diagnosis not present

## 2023-11-11 LAB — CUP PACEART REMOTE DEVICE CHECK
Date Time Interrogation Session: 20250923231005
Implantable Pulse Generator Implant Date: 20230120

## 2023-11-12 ENCOUNTER — Ambulatory Visit: Payer: Self-pay | Admitting: Cardiology

## 2023-11-17 NOTE — Progress Notes (Signed)
 Remote Loop Recorder Transmission

## 2023-11-18 NOTE — Progress Notes (Signed)
 Remote Loop Recorder Transmission

## 2023-11-29 ENCOUNTER — Encounter

## 2023-12-13 ENCOUNTER — Ambulatory Visit

## 2023-12-13 DIAGNOSIS — I639 Cerebral infarction, unspecified: Secondary | ICD-10-CM | POA: Diagnosis not present

## 2023-12-13 LAB — CUP PACEART REMOTE DEVICE CHECK
Date Time Interrogation Session: 20251026231616
Implantable Pulse Generator Implant Date: 20230120

## 2023-12-15 ENCOUNTER — Ambulatory Visit: Payer: Self-pay | Admitting: Cardiology

## 2023-12-16 NOTE — Progress Notes (Signed)
 Remote Loop Recorder Transmission

## 2023-12-22 ENCOUNTER — Ambulatory Visit: Admitting: Physician Assistant

## 2024-01-03 ENCOUNTER — Encounter

## 2024-01-12 ENCOUNTER — Ambulatory Visit: Admitting: Physician Assistant

## 2024-01-13 ENCOUNTER — Encounter

## 2024-01-14 ENCOUNTER — Ambulatory Visit: Attending: Cardiology

## 2024-01-14 DIAGNOSIS — I639 Cerebral infarction, unspecified: Secondary | ICD-10-CM | POA: Diagnosis not present

## 2024-01-15 LAB — CUP PACEART REMOTE DEVICE CHECK
Date Time Interrogation Session: 20251127231033
Implantable Pulse Generator Implant Date: 20230120

## 2024-01-17 ENCOUNTER — Ambulatory Visit: Payer: Self-pay | Admitting: Cardiology

## 2024-01-19 NOTE — Progress Notes (Signed)
 Remote Loop Recorder Transmission

## 2024-02-07 ENCOUNTER — Encounter

## 2024-02-11 NOTE — Progress Notes (Deleted)
 "    Cardiology Office Note Date:  02/11/2024  Patient ID:  Kristy [name], Kristy [name], Kristy [name], MRN [id number] PCP:  Theo Iha, MD  Electrophysiologist: Dr. Cindie     Chief Complaint:   *** over due  History of Present Illness: Kristy [name] is a 68 y.o. female with history of dizziness/migraine HAs, HLD, sarcoidosis (pulmonary), stroke  Jan 2023 suffered a stroke, during her hospitalization underwent ILR implant for AFib surveillance.  1:1 SVT detected on loop, planned to come in and discuss management strategies, likely addition of a BB  No AFib to date  I sw her in Aug 2024 She thinks that the couple days leading up to the day of the tachycardia may have contributed. She had sone some yard work, did cleaning in her and a neighbors house with clorox, and cleaners, and helped her sister with some cleaning as well, may have over done it/perhaps the cleaning chemical fumes. She was well aware of the tachycardia, made her feel weak. Has not had that previously No CP, no SOB Does not exercise, but is very busy all of the time. No near syncope or syncope.  She reports dizziness, this is not new, known to have vertigo Definitely a feeling of motion, sometimes so severe she falls/has to get to seated, to help her feel less intense. Goes back years Pre-stroke even She had self stopped her plavix , statin felt they were making her feel poorly She assures me in communication with her PMD, and that they are watching her cholesterol closely  Started on Toprol  for her SVT and some PVCs noted during device interrogation (PVC counter programmed on) Advised f/u with her PMD for her vertigo  I saw her 03/14/23 She is doing very well Tolerating Toprol  No palpitations, CP or cardiac awareness No SOB No near syncope or syncope  TODAY  *** any AFib? *** PVCs?, SVTs? *** symptoms *** new EP MD *** PRN?   Device information MDT LINQ II implanted 03/07/21 for cryptogenic  stroke   Past Medical History:  Diagnosis Date   Arthritis    Dizziness    Dizziness and giddiness    Hyperlipidemia    Migraine    Sarcoidosis    pt states she is unable to lay flat   Stress incontinence    Stroke Kristy [name])    SVT (supraventricular tachycardia)    Vertigo     Past Surgical History:  Procedure Laterality Date   BLADDER SUSPENSION N/A 12/07/2019   Procedure: TRANSVAGINAL TAPE (TVT) PROCEDURE;  Surgeon: Henry Slough, MD;  Location: [facility] OR;  Service: Gynecology;  Laterality: N/A;   CYSTOSCOPY N/A 12/07/2019   Procedure: CYSTOSCOPY;  Surgeon: Henry Slough, MD;  Location: [name];  Service: Gynecology;  Laterality: N/A;   LOOP RECORDER INSERTION N/A 03/07/2021   Procedure: LOOP RECORDER INSERTION;  Surgeon: Cindie Ole DASEN, MD;  Location[name] INVASIVE CV LAB;  Service: Cardiovascular;  Laterality: N/A;   TRANSFORAMINAL LUMBAR INTERBODY FUSION (TLIF) WITH PEDICLE SCREW FIXATION 1 LEVEL N/A 03/25/2023   Procedure: TRANSFORAMINAL LUMBAR INTERBODY FUSION (TLIF) WITH PEDICLE SCREW FIXATION 1 LEVEL  L4-L5;  Surgeon: Burnetta Aures, MD;  Location: [facility] OR;  Service: Orthopedics;  Laterality: N/A;  240    Current Outpatient Medications  Medication Sig Dispense Refill   cetirizine (ZYRTEC) 10 MG tablet Take 10 mg by mouth daily as needed for allergies.     Docusate Sodium  100 MG capsule Take 100 mg by mouth daily.     donepezil  (ARICEPT )  5 MG tablet Take 5 mg by mouth at bedtime.     fluticasone  (FLONASE ) 50 MCG/ACT nasal spray Place 1 spray into both nostrils daily as needed for allergies.     Magnesium  Oxide (MAG-OX PO) Take 400 mg by mouth 2 (two) times daily.     meclizine  (ANTIVERT ) 25 MG tablet Take 25 mg by mouth 3 (three) times daily as needed for dizziness.     metoprolol  tartrate (LOPRESSOR ) 25 MG tablet Take 25 mg by mouth 2 (two) times daily.     mirabegron  ER (MYRBETRIQ ) 25 MG TB24 tablet Take 25 mg by mouth daily.     ondansetron  (ZOFRAN ) 4 MG tablet Take 1  tablet (4 mg total) by mouth every 8 (eight) hours as needed for nausea or vomiting. 20 tablet 0   oxymetazoline  (AFRIN) 0.05 % nasal spray Place 1 spray into both nostrils daily as needed for congestion.     phentermine  37.5 MG capsule Take 37.5 mg by mouth every morning.     Probiotic Product (PROBIOTIC GUMMIES PO) Take 1 tablet by mouth daily. 500 million     No current facility-administered medications for this visit.    Allergies:   Patient has no known allergies.   Social History:  The patient  reports that she has never smoked. She has never used smokeless tobacco. She reports that she does not drink alcohol and does not use drugs.   Family History:  The patient's family history includes Alzheimer's disease in her mother; Breast cancer in her maternal aunt and paternal aunt; COPD in her father.  ROS:  Please see the history of present illness.    All other systems are reviewed and otherwise negative.   PHYSICAL EXAM:  VS:  There were no vitals taken for this visit. BMI: There is no height or weight on file to calculate BMI. Well nourished, well developed, in no acute distress HEENT: normocephalic, atraumatic Neck: no JVD, carotid bruits or masses Cardiac: ***  RRR; no significant murmurs, no rubs, or gallops Lungs: *** CTA b/l, no wheezing, rhonchi or rales Abd: soft, nontender MS: no deformity or atrophy Ext: *** no edema Skin: warm and dry, no rash Neuro:  No gross deficits appreciated Psych: euthymic mood, full affect  *** ILR site is stable, no tethering or discomfort   EKG:  not done today  Device interrogation carelink dated *** today and reviewed by myself:  *** Battery is good *** *** % PVCs    03/06/21: TTE 1. Left ventricular ejection fraction, by estimation, is 55 to 60%. The  left ventricle has normal function. The left ventricle has no regional  wall motion abnormalities. Left ventricular diastolic parameters are  consistent with Grade I diastolic   dysfunction (impaired relaxation).   2. Right ventricular systolic function is normal. The right ventricular  size is normal.   3. The mitral valve is normal in structure. Trivial mitral valve  regurgitation. No evidence of mitral stenosis.   4. The aortic valve is tricuspid. Aortic valve regurgitation is mild. No  aortic stenosis is present.   5. The inferior vena cava is normal in size with greater than 50%  respiratory variability, suggesting right atrial pressure of 3 mmHg.   Comparison(s): No prior Echocardiogram.   Recent Labs: 03/18/2023: BUN 10; Creatinine, Ser 0.80; Hemoglobin 12.9; Platelets 286; Potassium 3.8; Sodium 141  No results found for requested labs within last 365 days.   CrCl cannot be calculated (Patient's most recent lab result is older  than the maximum 21 days allowed.).   Wt Readings from Last 3 Encounters:  03/25/23 140 lb (63.5 kg)  03/18/23 145 lb (65.8 kg)  01/02/23 147 lb (66.7 kg)     Other studies reviewed: Additional studies/records reviewed today include: summarized above  ASSESSMENT AND PLAN:  ILR *** no AFib to date Cryptogenic stroke  SVT *** None further  3. PVCs *** Continue Toprol  *** None appreciated today on device observation or exam   Disposition:  back in ***, sooner if needed  Current medicines are reviewed at length with the patient today.  The patient did not have any concerns regarding medicines.  Bonney Charlies Arthur, PA-C 02/11/2024 10:42 AM     [facility] [address] [place] [address] [phone] (office)  [phone] (fax)   "

## 2024-02-14 ENCOUNTER — Ambulatory Visit: Attending: Cardiology

## 2024-02-14 DIAGNOSIS — I639 Cerebral infarction, unspecified: Secondary | ICD-10-CM

## 2024-02-15 LAB — CUP PACEART REMOTE DEVICE CHECK
Date Time Interrogation Session: 20251228231413
Implantable Pulse Generator Implant Date: 20230120

## 2024-02-20 ENCOUNTER — Ambulatory Visit: Payer: Self-pay | Admitting: Cardiology

## 2024-02-22 ENCOUNTER — Ambulatory Visit: Admitting: Physician Assistant

## 2024-02-22 NOTE — Progress Notes (Signed)
 Remote Loop Recorder Transmission

## 2024-03-13 ENCOUNTER — Encounter

## 2024-03-16 ENCOUNTER — Ambulatory Visit: Admitting: Student

## 2024-03-16 ENCOUNTER — Encounter: Payer: Self-pay | Admitting: Student

## 2024-03-16 ENCOUNTER — Ambulatory Visit: Attending: Cardiology

## 2024-03-16 VITALS — BP 126/76 | HR 72 | Ht 64.0 in | Wt 145.0 lb

## 2024-03-16 DIAGNOSIS — I471 Supraventricular tachycardia, unspecified: Secondary | ICD-10-CM

## 2024-03-16 DIAGNOSIS — Z4509 Encounter for adjustment and management of other cardiac device: Secondary | ICD-10-CM

## 2024-03-16 DIAGNOSIS — I493 Ventricular premature depolarization: Secondary | ICD-10-CM | POA: Diagnosis not present

## 2024-03-16 DIAGNOSIS — I639 Cerebral infarction, unspecified: Secondary | ICD-10-CM

## 2024-03-16 LAB — CUP PACEART REMOTE DEVICE CHECK
Date Time Interrogation Session: 20260128231227
Implantable Pulse Generator Implant Date: 20230120

## 2024-03-16 MED ORDER — METOPROLOL TARTRATE 25 MG PO TABS: 25.0000 mg | ORAL_TABLET | Freq: Two times a day (BID) | ORAL | 3 refills | Status: AC

## 2024-03-16 NOTE — Patient Instructions (Signed)
 Medication Instructions:  No medication changes today. *If you need a refill on your cardiac medications before your next appointment, please call your pharmacy*  Lab Work: No labwork ordered today. If you have labs (blood work) drawn today and your tests are completely normal, you will receive your results only by: MyChart Message (if you have MyChart) OR A paper copy in the mail If you have any lab test that is abnormal or we need to change your treatment, we will call you to review the results.  Testing/Procedures: No testing ordered today  Follow-Up: At [facility], you and your health needs are our priority.  As part of our continuing mission to provide you with exceptional heart care, our providers are all part of one team.  This team includes your primary Cardiologist (physician) and Advanced Practice Providers or APPs (Physician Assistants and Nurse Practitioners) who all work together to provide you with the care you need, when you need it.  Your next appointment:   12 month(s)  Provider:   You may see Ardeen Kohler, MD or one of the following Advanced Practice Providers on your designated Care Team:   Mertha Abrahams, Kennard Pea "Jonelle Neri" [place], PA-C Suzann Riddle, NP Creighton Doffing, NP    We recommend signing up for the patient portal called "MyChart".  Sign up information is provided on this After Visit Summary.  MyChart is used to connect with patients for Virtual Visits (Telemedicine).  Patients are able to view lab/test results, encounter notes, upcoming appointments, etc.  Non-urgent messages can be sent to your provider as well.   To learn more about what you can do with MyChart, go to [email or web address].

## 2024-03-16 NOTE — Progress Notes (Signed)
" °  Electrophysiology Office Note:   Date:  03/16/2024  ID:  MONNIE GUDGEL, DOB 1955/04/11, MRN [id number]  Primary Cardiologist: None Electrophysiologist: Fonda Kitty, MD   Electrophysiologist:  Fonda Kitty, MD      History of Present Illness:   Kristy [name] is a 69 y.o. female with h/o dizziness/migraines, HAs, HLD, CVA s/p ILR implant, sarcoidosis, and SVT seen today for routine electrophysiology followup.   Since last being seen in our clinic the patient reports doing well overall. No breakthrough arrhythmia of which she is aware. Overall, she denies chest pain, palpitations, PND, orthopnea, nausea, vomiting, dizziness, syncope, edema, weight gain, or early satiety.  She has some dyspnea which he contributes to her sarcoidosis. She is able to do her ADLs and yardwork without difficulty.  Review of systems complete and found to be negative unless listed in HPI.   EP Information / Studies Reviewed:    EKG is ordered today. Personal review as below.  EKG Interpretation Date/Time:  Thursday March 16 2024 10:22:12 EST Ventricular Rate:  72 PR Interval:  166 QRS Duration:  130 QT Interval:  446 QTC Calculation: 488 R Axis:   123  Text Interpretation: Normal sinus rhythm Right bundle branch block Left posterior fascicular block Bifascicular block Confirmed by Lesia Heck ([id number]) on 03/16/2024 10:24:58 AM    Arrhythmia/Device History MDT LINQ II implanted 03/07/21 for cryptogenic stroke   Physical Exam:   VS:  BP 126/76   Pulse 72   Ht 5' 4 (1.626 m)   Wt 145 lb (65.8 kg)   SpO2 98%   BMI 24.89 kg/m    Wt Readings from Last 3 Encounters:  03/16/24 145 lb (65.8 kg)  03/25/23 140 lb (63.5 kg)  03/18/23 145 lb (65.8 kg)     GEN: No acute distress NECK: No JVD; No carotid bruits CARDIAC: Regular rate and rhythm, no murmurs, rubs, gallops RESPIRATORY:  Clear to auscultation without rales, wheezing or rhonchi  ABDOMEN: Soft, non-tender, non-distended EXTREMITIES:  No  edema; No deformity   ASSESSMENT AND PLAN:    CVA s/p ILR No AF noted to date Remote report from 03/15/2024 reviewed.  No AF or SVT  SVT PVCs EKG today shows NSR By device reports 2.4% PVC burden. No tachy episodes.  Continue lopressor  25 mg BID   Follow up with EP Team in 12 months  Signed, Ozell Prentice Lesia, PA-C  "

## 2024-03-19 ENCOUNTER — Ambulatory Visit: Payer: Self-pay | Admitting: Cardiology

## 2024-03-24 NOTE — Progress Notes (Signed)
 Remote Loop Recorder Transmission

## 2024-04-16 ENCOUNTER — Ambulatory Visit

## 2024-05-17 ENCOUNTER — Ambulatory Visit

## 2024-06-17 ENCOUNTER — Ambulatory Visit

## 2024-07-18 ENCOUNTER — Ambulatory Visit

## 2024-08-18 ENCOUNTER — Ambulatory Visit
